# Patient Record
Sex: Female | Born: 1955 | Race: White | Hispanic: No | Smoking: Current every day smoker
Health system: Southern US, Community
[De-identification: ages and names within clinical notes are randomized; demographics above are authoritative.]

## PROBLEM LIST (undated history)

## (undated) DIAGNOSIS — G8929 Other chronic pain: Secondary | ICD-10-CM

## (undated) DIAGNOSIS — K219 Gastro-esophageal reflux disease without esophagitis: Secondary | ICD-10-CM

## (undated) DIAGNOSIS — M549 Dorsalgia, unspecified: Secondary | ICD-10-CM

## (undated) HISTORY — PX: TONSILLECTOMY: SUR1361

## (undated) HISTORY — PX: TOTAL KNEE ARTHROPLASTY: SHX125

## (undated) HISTORY — PX: APPENDECTOMY: SHX54

---

## 1998-02-26 ENCOUNTER — Ambulatory Visit (HOSPITAL_COMMUNITY): Admission: RE | Admit: 1998-02-26 | Discharge: 1998-02-26 | Payer: Self-pay | Admitting: General Surgery

## 1999-01-19 ENCOUNTER — Emergency Department (HOSPITAL_COMMUNITY): Admission: EM | Admit: 1999-01-19 | Discharge: 1999-01-19 | Payer: Self-pay | Admitting: *Deleted

## 1999-01-31 ENCOUNTER — Encounter: Admission: RE | Admit: 1999-01-31 | Discharge: 1999-01-31 | Payer: Self-pay | Admitting: Family Medicine

## 1999-03-01 ENCOUNTER — Encounter: Admission: RE | Admit: 1999-03-01 | Discharge: 1999-03-01 | Payer: Self-pay | Admitting: Family Medicine

## 1999-06-19 ENCOUNTER — Encounter: Admission: RE | Admit: 1999-06-19 | Discharge: 1999-07-18 | Payer: Self-pay | Admitting: Specialist

## 1999-07-11 ENCOUNTER — Encounter: Admission: RE | Admit: 1999-07-11 | Discharge: 1999-07-11 | Payer: Self-pay | Admitting: Family Medicine

## 1999-12-25 ENCOUNTER — Encounter: Admission: RE | Admit: 1999-12-25 | Discharge: 1999-12-25 | Payer: Self-pay | Admitting: Family Medicine

## 1999-12-25 ENCOUNTER — Other Ambulatory Visit: Admission: RE | Admit: 1999-12-25 | Discharge: 1999-12-25 | Payer: Self-pay | Admitting: Obstetrics & Gynecology

## 2000-02-28 ENCOUNTER — Encounter: Payer: Self-pay | Admitting: Neurosurgery

## 2000-02-28 ENCOUNTER — Ambulatory Visit (HOSPITAL_COMMUNITY): Admission: RE | Admit: 2000-02-28 | Discharge: 2000-02-28 | Payer: Self-pay | Admitting: Neurosurgery

## 2000-03-04 ENCOUNTER — Ambulatory Visit (HOSPITAL_COMMUNITY): Admission: RE | Admit: 2000-03-04 | Discharge: 2000-03-04 | Payer: Self-pay | Admitting: Specialist

## 2000-03-04 ENCOUNTER — Encounter: Payer: Self-pay | Admitting: Specialist

## 2000-03-27 ENCOUNTER — Encounter: Payer: Self-pay | Admitting: Neurosurgery

## 2000-03-30 ENCOUNTER — Encounter: Admission: RE | Admit: 2000-03-30 | Discharge: 2000-03-30 | Payer: Self-pay | Admitting: Family Medicine

## 2000-03-31 ENCOUNTER — Inpatient Hospital Stay (HOSPITAL_COMMUNITY): Admission: RE | Admit: 2000-03-31 | Discharge: 2000-04-06 | Payer: Self-pay | Admitting: Neurosurgery

## 2000-03-31 ENCOUNTER — Encounter: Payer: Self-pay | Admitting: Neurosurgery

## 2000-05-15 ENCOUNTER — Encounter: Admission: RE | Admit: 2000-05-15 | Discharge: 2000-05-15 | Payer: Self-pay | Admitting: Family Medicine

## 2000-05-18 ENCOUNTER — Encounter: Admission: RE | Admit: 2000-05-18 | Discharge: 2000-07-09 | Payer: Self-pay | Admitting: Neurosurgery

## 2000-06-18 ENCOUNTER — Encounter: Admission: RE | Admit: 2000-06-18 | Discharge: 2000-06-18 | Payer: Self-pay | Admitting: Family Medicine

## 2000-07-02 ENCOUNTER — Encounter: Admission: RE | Admit: 2000-07-02 | Discharge: 2000-07-02 | Payer: Self-pay | Admitting: Family Medicine

## 2000-07-16 ENCOUNTER — Encounter: Admission: RE | Admit: 2000-07-16 | Discharge: 2000-07-16 | Payer: Self-pay | Admitting: Family Medicine

## 2000-08-19 ENCOUNTER — Encounter: Admission: RE | Admit: 2000-08-19 | Discharge: 2000-08-19 | Payer: Self-pay | Admitting: Family Medicine

## 2000-10-13 ENCOUNTER — Encounter: Admission: RE | Admit: 2000-10-13 | Discharge: 2000-10-13 | Payer: Self-pay | Admitting: Family Medicine

## 2000-10-25 ENCOUNTER — Encounter: Payer: Self-pay | Admitting: Neurosurgery

## 2000-10-25 ENCOUNTER — Ambulatory Visit (HOSPITAL_COMMUNITY): Admission: RE | Admit: 2000-10-25 | Discharge: 2000-10-25 | Payer: Self-pay | Admitting: Neurosurgery

## 2000-10-27 ENCOUNTER — Encounter: Admission: RE | Admit: 2000-10-27 | Discharge: 2000-10-27 | Payer: Self-pay | Admitting: Family Medicine

## 2000-12-28 ENCOUNTER — Encounter: Admission: RE | Admit: 2000-12-28 | Discharge: 2001-02-10 | Payer: Self-pay | Admitting: Neurosurgery

## 2001-04-08 ENCOUNTER — Encounter: Admission: RE | Admit: 2001-04-08 | Discharge: 2001-04-08 | Payer: Self-pay | Admitting: Family Medicine

## 2001-04-14 ENCOUNTER — Encounter: Payer: Self-pay | Admitting: Sports Medicine

## 2001-04-14 ENCOUNTER — Encounter: Admission: RE | Admit: 2001-04-14 | Discharge: 2001-04-14 | Payer: Self-pay | Admitting: Sports Medicine

## 2001-12-14 ENCOUNTER — Encounter: Admission: RE | Admit: 2001-12-14 | Discharge: 2001-12-14 | Payer: Self-pay | Admitting: Family Medicine

## 2002-06-13 ENCOUNTER — Emergency Department (HOSPITAL_COMMUNITY): Admission: EM | Admit: 2002-06-13 | Discharge: 2002-06-13 | Payer: Self-pay | Admitting: Emergency Medicine

## 2002-06-13 ENCOUNTER — Encounter: Payer: Self-pay | Admitting: Emergency Medicine

## 2002-11-04 ENCOUNTER — Ambulatory Visit (HOSPITAL_COMMUNITY): Admission: RE | Admit: 2002-11-04 | Discharge: 2002-11-04 | Payer: Self-pay | Admitting: Family Medicine

## 2002-11-04 ENCOUNTER — Encounter: Admission: RE | Admit: 2002-11-04 | Discharge: 2002-11-04 | Payer: Self-pay | Admitting: Family Medicine

## 2002-11-29 ENCOUNTER — Encounter: Admission: RE | Admit: 2002-11-29 | Discharge: 2002-11-29 | Payer: Self-pay | Admitting: Family Medicine

## 2002-12-07 ENCOUNTER — Encounter: Admission: RE | Admit: 2002-12-07 | Discharge: 2002-12-07 | Payer: Self-pay | Admitting: Family Medicine

## 2003-01-09 ENCOUNTER — Encounter: Admission: RE | Admit: 2003-01-09 | Discharge: 2003-01-09 | Payer: Self-pay | Admitting: Family Medicine

## 2003-01-20 ENCOUNTER — Encounter: Admission: RE | Admit: 2003-01-20 | Discharge: 2003-01-20 | Payer: Self-pay | Admitting: Family Medicine

## 2003-03-13 ENCOUNTER — Encounter: Admission: RE | Admit: 2003-03-13 | Discharge: 2003-03-13 | Payer: Self-pay | Admitting: Orthopedic Surgery

## 2003-03-13 ENCOUNTER — Encounter: Payer: Self-pay | Admitting: Orthopedic Surgery

## 2003-03-14 ENCOUNTER — Ambulatory Visit (HOSPITAL_BASED_OUTPATIENT_CLINIC_OR_DEPARTMENT_OTHER): Admission: RE | Admit: 2003-03-14 | Discharge: 2003-03-15 | Payer: Self-pay | Admitting: Orthopedic Surgery

## 2003-03-14 ENCOUNTER — Ambulatory Visit (HOSPITAL_COMMUNITY): Admission: RE | Admit: 2003-03-14 | Discharge: 2003-03-14 | Payer: Self-pay | Admitting: Orthopedic Surgery

## 2003-06-28 ENCOUNTER — Encounter: Admission: RE | Admit: 2003-06-28 | Discharge: 2003-07-26 | Payer: Self-pay | Admitting: Orthopedic Surgery

## 2003-09-04 ENCOUNTER — Encounter: Admission: RE | Admit: 2003-09-04 | Discharge: 2003-09-04 | Payer: Self-pay | Admitting: Family Medicine

## 2003-09-06 ENCOUNTER — Encounter: Admission: RE | Admit: 2003-09-06 | Discharge: 2003-09-06 | Payer: Self-pay | Admitting: Sports Medicine

## 2003-10-19 ENCOUNTER — Encounter: Admission: RE | Admit: 2003-10-19 | Discharge: 2003-10-19 | Payer: Self-pay | Admitting: Sports Medicine

## 2003-10-24 ENCOUNTER — Encounter: Admission: RE | Admit: 2003-10-24 | Discharge: 2003-10-24 | Payer: Self-pay | Admitting: Family Medicine

## 2003-11-02 ENCOUNTER — Encounter: Admission: RE | Admit: 2003-11-02 | Discharge: 2003-11-02 | Payer: Self-pay | Admitting: Family Medicine

## 2003-11-22 ENCOUNTER — Encounter: Admission: RE | Admit: 2003-11-22 | Discharge: 2003-11-22 | Payer: Self-pay | Admitting: Sports Medicine

## 2003-12-06 ENCOUNTER — Encounter: Admission: RE | Admit: 2003-12-06 | Discharge: 2004-01-09 | Payer: Self-pay | Admitting: *Deleted

## 2004-02-02 ENCOUNTER — Encounter: Admission: RE | Admit: 2004-02-02 | Discharge: 2004-02-02 | Payer: Self-pay | Admitting: Family Medicine

## 2004-02-22 ENCOUNTER — Ambulatory Visit: Payer: Self-pay | Admitting: Family Medicine

## 2004-04-24 ENCOUNTER — Ambulatory Visit: Payer: Self-pay | Admitting: Sports Medicine

## 2004-05-06 ENCOUNTER — Encounter: Admission: RE | Admit: 2004-05-06 | Discharge: 2004-05-06 | Payer: Self-pay | Admitting: Family Medicine

## 2004-05-27 ENCOUNTER — Ambulatory Visit: Payer: Self-pay | Admitting: Family Medicine

## 2004-06-09 ENCOUNTER — Encounter (INDEPENDENT_AMBULATORY_CARE_PROVIDER_SITE_OTHER): Payer: Self-pay | Admitting: *Deleted

## 2004-06-09 LAB — CONVERTED CEMR LAB: Pap Smear: NORMAL

## 2004-07-04 ENCOUNTER — Ambulatory Visit: Payer: Self-pay | Admitting: Sports Medicine

## 2004-07-26 ENCOUNTER — Ambulatory Visit (HOSPITAL_COMMUNITY): Admission: RE | Admit: 2004-07-26 | Discharge: 2004-07-26 | Payer: Self-pay | Admitting: Family Medicine

## 2004-08-05 ENCOUNTER — Ambulatory Visit: Payer: Self-pay | Admitting: Family Medicine

## 2004-09-05 ENCOUNTER — Ambulatory Visit: Payer: Self-pay | Admitting: Family Medicine

## 2004-10-04 ENCOUNTER — Ambulatory Visit: Payer: Self-pay | Admitting: Family Medicine

## 2004-10-15 ENCOUNTER — Ambulatory Visit: Payer: Self-pay | Admitting: Family Medicine

## 2005-01-31 ENCOUNTER — Encounter: Admission: RE | Admit: 2005-01-31 | Discharge: 2005-01-31 | Payer: Self-pay | Admitting: Specialist

## 2005-03-10 ENCOUNTER — Ambulatory Visit: Payer: Self-pay | Admitting: Family Medicine

## 2005-03-11 ENCOUNTER — Encounter: Admission: RE | Admit: 2005-03-11 | Discharge: 2005-03-11 | Payer: Self-pay | Admitting: Sports Medicine

## 2005-03-11 ENCOUNTER — Ambulatory Visit: Payer: Self-pay | Admitting: Sports Medicine

## 2005-04-08 ENCOUNTER — Ambulatory Visit: Payer: Self-pay | Admitting: Family Medicine

## 2005-07-09 ENCOUNTER — Ambulatory Visit: Payer: Self-pay | Admitting: Family Medicine

## 2005-09-01 ENCOUNTER — Encounter: Admission: RE | Admit: 2005-09-01 | Discharge: 2005-09-01 | Payer: Self-pay | Admitting: Sports Medicine

## 2005-09-01 ENCOUNTER — Ambulatory Visit: Payer: Self-pay | Admitting: Family Medicine

## 2005-09-02 ENCOUNTER — Encounter: Admission: RE | Admit: 2005-09-02 | Discharge: 2005-09-02 | Payer: Self-pay | Admitting: Sports Medicine

## 2005-10-23 ENCOUNTER — Encounter (INDEPENDENT_AMBULATORY_CARE_PROVIDER_SITE_OTHER): Payer: Self-pay | Admitting: *Deleted

## 2005-10-23 ENCOUNTER — Ambulatory Visit (HOSPITAL_BASED_OUTPATIENT_CLINIC_OR_DEPARTMENT_OTHER): Admission: RE | Admit: 2005-10-23 | Discharge: 2005-10-23 | Payer: Self-pay | Admitting: Orthopedic Surgery

## 2005-11-13 ENCOUNTER — Ambulatory Visit: Payer: Self-pay | Admitting: Family Medicine

## 2006-01-01 ENCOUNTER — Ambulatory Visit: Payer: Self-pay | Admitting: Sports Medicine

## 2006-01-12 ENCOUNTER — Ambulatory Visit: Payer: Self-pay | Admitting: Family Medicine

## 2006-02-06 ENCOUNTER — Ambulatory Visit: Payer: Self-pay | Admitting: Family Medicine

## 2006-02-20 ENCOUNTER — Inpatient Hospital Stay (HOSPITAL_COMMUNITY): Admission: RE | Admit: 2006-02-20 | Discharge: 2006-02-24 | Payer: Self-pay | Admitting: Specialist

## 2006-03-25 ENCOUNTER — Encounter: Admission: RE | Admit: 2006-03-25 | Discharge: 2006-04-23 | Payer: Self-pay | Admitting: Specialist

## 2006-05-20 ENCOUNTER — Encounter: Admission: RE | Admit: 2006-05-20 | Discharge: 2006-05-20 | Payer: Self-pay | Admitting: Neurosurgery

## 2006-06-04 ENCOUNTER — Encounter: Admission: RE | Admit: 2006-06-04 | Discharge: 2006-06-04 | Payer: Self-pay | Admitting: Neurosurgery

## 2006-07-13 ENCOUNTER — Observation Stay (HOSPITAL_COMMUNITY): Admission: RE | Admit: 2006-07-13 | Discharge: 2006-07-13 | Payer: Self-pay | Admitting: Neurosurgery

## 2006-08-06 DIAGNOSIS — M949 Disorder of cartilage, unspecified: Secondary | ICD-10-CM

## 2006-08-06 DIAGNOSIS — I10 Essential (primary) hypertension: Secondary | ICD-10-CM

## 2006-08-06 DIAGNOSIS — J309 Allergic rhinitis, unspecified: Secondary | ICD-10-CM | POA: Insufficient documentation

## 2006-08-06 DIAGNOSIS — J45909 Unspecified asthma, uncomplicated: Secondary | ICD-10-CM | POA: Insufficient documentation

## 2006-08-06 DIAGNOSIS — M899 Disorder of bone, unspecified: Secondary | ICD-10-CM | POA: Insufficient documentation

## 2006-08-06 DIAGNOSIS — E669 Obesity, unspecified: Secondary | ICD-10-CM | POA: Insufficient documentation

## 2006-08-06 DIAGNOSIS — F17200 Nicotine dependence, unspecified, uncomplicated: Secondary | ICD-10-CM

## 2006-08-06 DIAGNOSIS — M1990 Unspecified osteoarthritis, unspecified site: Secondary | ICD-10-CM | POA: Insufficient documentation

## 2006-08-06 DIAGNOSIS — E7800 Pure hypercholesterolemia, unspecified: Secondary | ICD-10-CM

## 2006-08-06 DIAGNOSIS — K219 Gastro-esophageal reflux disease without esophagitis: Secondary | ICD-10-CM

## 2006-08-07 ENCOUNTER — Encounter (INDEPENDENT_AMBULATORY_CARE_PROVIDER_SITE_OTHER): Payer: Self-pay | Admitting: *Deleted

## 2007-01-11 ENCOUNTER — Encounter (INDEPENDENT_AMBULATORY_CARE_PROVIDER_SITE_OTHER): Payer: Self-pay | Admitting: Family Medicine

## 2007-02-05 ENCOUNTER — Encounter (INDEPENDENT_AMBULATORY_CARE_PROVIDER_SITE_OTHER): Payer: Self-pay | Admitting: Family Medicine

## 2007-02-22 ENCOUNTER — Telehealth (INDEPENDENT_AMBULATORY_CARE_PROVIDER_SITE_OTHER): Payer: Self-pay | Admitting: Family Medicine

## 2007-03-11 ENCOUNTER — Ambulatory Visit: Payer: Self-pay | Admitting: Family Medicine

## 2007-03-11 LAB — CONVERTED CEMR LAB: HDL goal, serum: 40 mg/dL

## 2007-03-19 ENCOUNTER — Ambulatory Visit: Payer: Self-pay | Admitting: Family Medicine

## 2007-03-19 ENCOUNTER — Encounter (INDEPENDENT_AMBULATORY_CARE_PROVIDER_SITE_OTHER): Payer: Self-pay | Admitting: Family Medicine

## 2007-03-19 LAB — CONVERTED CEMR LAB
AST: 13 units/L (ref 0–37)
Albumin: 4.5 g/dL (ref 3.5–5.2)
Alkaline Phosphatase: 111 units/L (ref 39–117)
BUN: 15 mg/dL (ref 6–23)
Calcium: 9.5 mg/dL (ref 8.4–10.5)
Chloride: 101 meq/L (ref 96–112)
HCT: 44.2 % (ref 36.0–46.0)
LDL Cholesterol: 56 mg/dL (ref 0–99)
MCV: 96.3 fL (ref 78.0–100.0)
RBC: 4.59 M/uL (ref 3.87–5.11)
TSH: 1.09 microintl units/mL (ref 0.350–5.50)
Total Bilirubin: 0.6 mg/dL (ref 0.3–1.2)
VLDL: 47 mg/dL — ABNORMAL HIGH (ref 0–40)

## 2007-03-31 ENCOUNTER — Encounter: Admission: RE | Admit: 2007-03-31 | Discharge: 2007-03-31 | Payer: Self-pay | Admitting: Family Medicine

## 2007-06-25 ENCOUNTER — Telehealth: Payer: Self-pay | Admitting: Family Medicine

## 2007-11-13 IMAGING — RF IR MYELOGRAM [PERSON_NAME]
12 of 24 series · 12 of 24 positions shown · IV contrast (omnipaque)
Comparison: none

CLINICAL DATA: Mid back pain.  Lower back pain.  Increasingly severe pain radiating to the left hip and leg.  Previous thoracic laminectomy with shunt for treatment of cyst and/or tumor. 
LUMBAR MYELOGRAM:
Following informed consent, sterile preparation of the back, and adequate local anesthesia, lumbar puncture was initially attempted at L5-S1 but was unsuccessful due to a mixed injection.  I repositioned the needle at L3-4 on the left from a right paramedian approach. 10cc of Omnipaque 180 was instilled into the subarachnoid space.  
Significant waist like narrowing at L4-5 with bilateral L5 nerve root encroachment is present.   Moderate tapering of the thecal sac below this, not definitely pathologic.  Moderate ventral defect at L3-4.  Conus medullaris normal. Flexion/extension films were not performed because of the requirement for thoracic myelogram.
TECHNIQUE: Multidetector CT imaging of the thoracic spine was performed after intrathecal injection of contrast.  Multiplanar CT image reconstructions were also generated.
TECHNIQUE: Multidetector CT imaging of the lumbar spine was performed after intrathecal injection of contrast.  Multiplanar CT image reconstructions were also generated.  
L1-2:  Normal interspace. 
L2-3:  Normal interspace. 
L3-4:  Moderate size central protrusion.  No definite L4 nerve root encroachment.  
L4-5:  Multifactorial stenosis related to broad based disc protrusion central and to the left along with marked posterior element hypertrophy; bilateral L5 nerve root encroachment is present left greater than right.  Vacuum disc phenomenon with disc space narrowing also present.  Some biforaminal narrowing but no definite L4 nerve root compromise. 
L5-S1:  Advanced disc space narrowing.  Mild facet arthropathy.  Moderate epidural fat appears to gently comptress the thecal sac, consistent with epidural lipomatosis.  Asymmetric foraminal narrowing is seen on the left   due to disc material, disc space narrowing, and bony overgrowth with left L5 nerve root compression.

[Series 2: myelogram  white · 1 of 1 slices shown (1 of 12)]
[im 1/1]
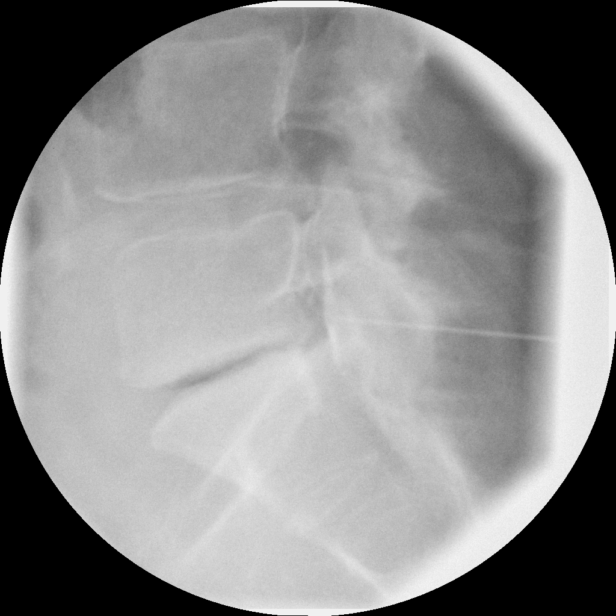

[Series 4: myelogram  white · 1 of 1 slices shown (2 of 12)]
[im 1/1]
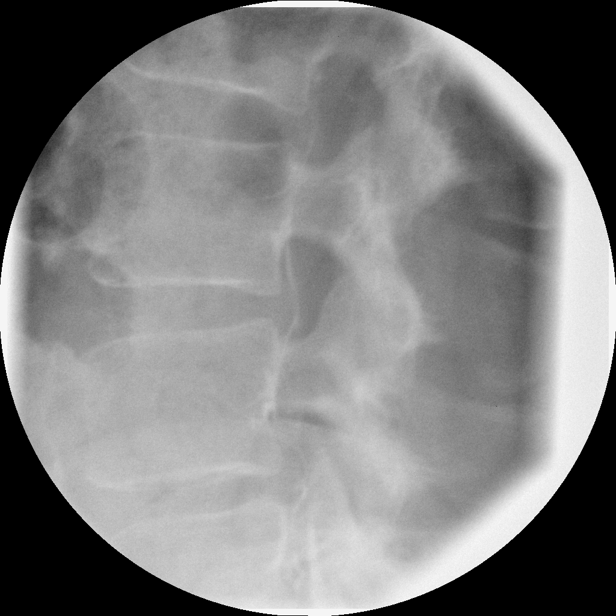

[Series 6: myelogram  white · 1 of 1 slices shown (3 of 12)]
[im 1/1]
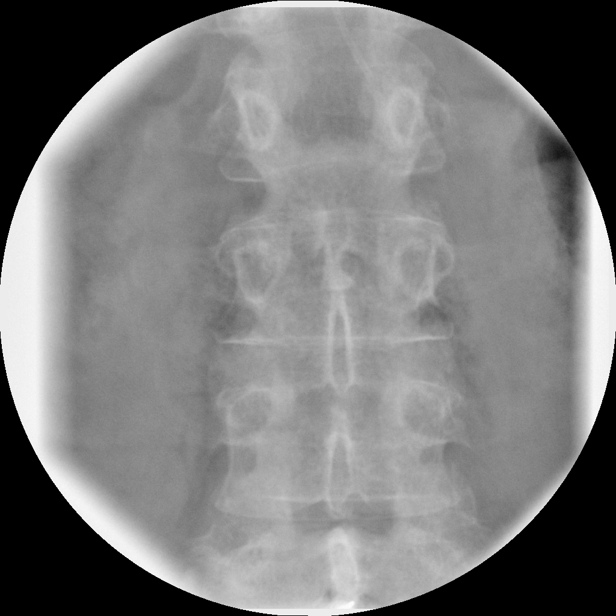

[Series 8: myelogram  white · 1 of 1 slices shown (4 of 12)]
[im 1/1]
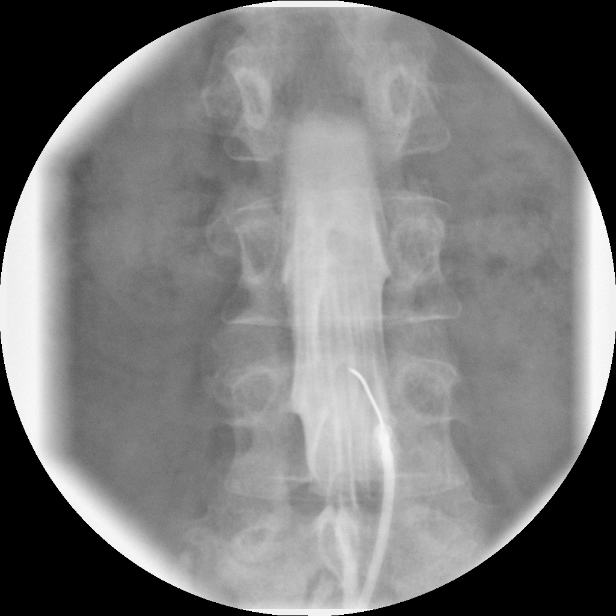

[Series 10: myelogram  white · 1 of 1 slices shown (5 of 12)]
[im 1/1]
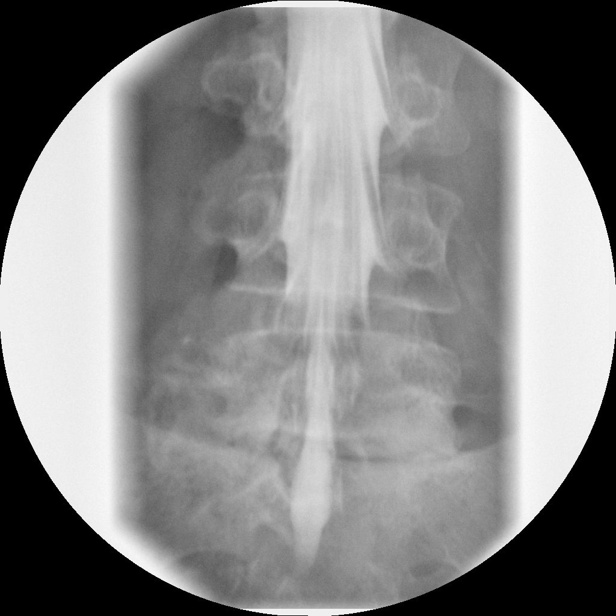

[Series 12: myelogram  white · 1 of 1 slices shown (6 of 12)]
[im 1/1]
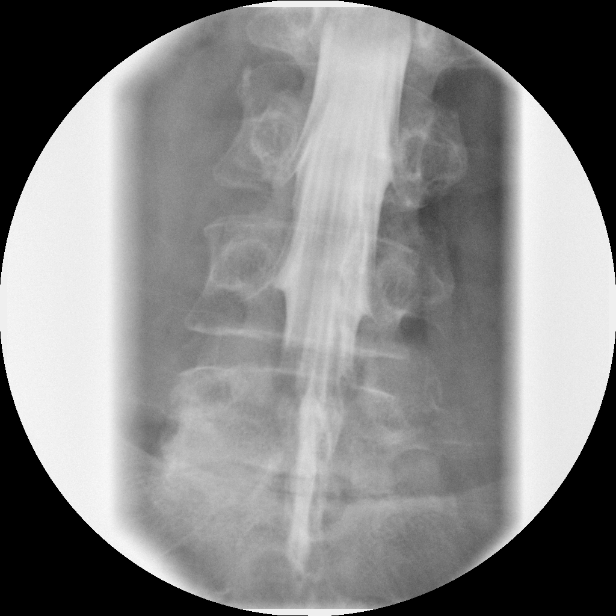

[Series 14: myelogram  white · 1 of 1 slices shown (7 of 12)]
[im 1/1]
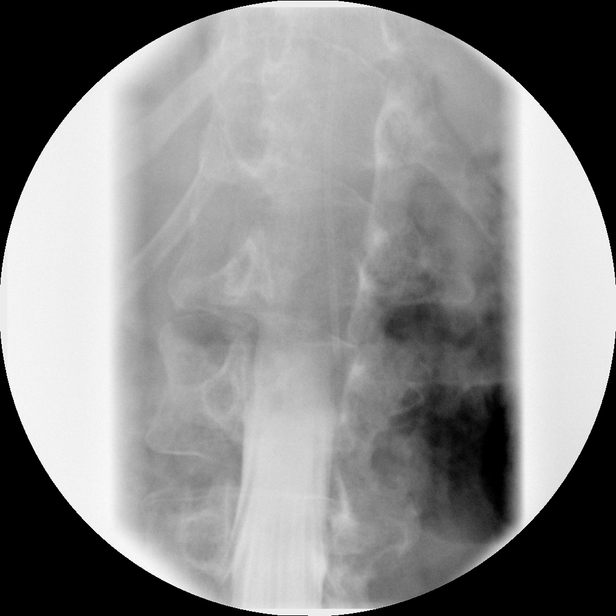

[Series 16: myelogram  white · 1 of 1 slices shown (8 of 12)]
[im 1/1]
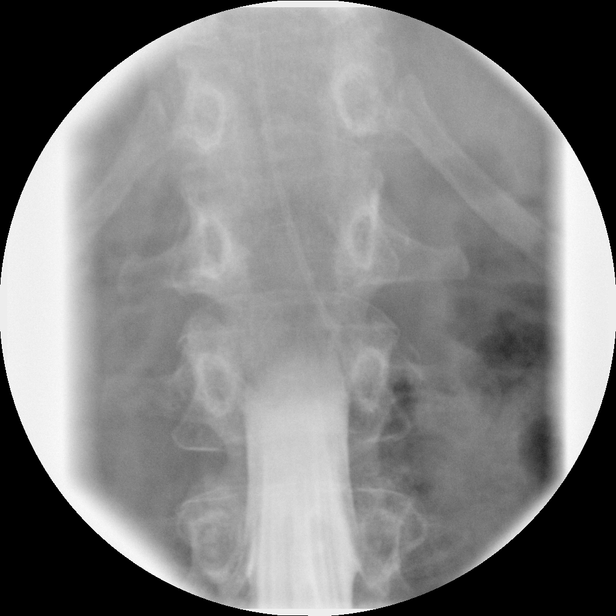

[Series 18: myelogram  white · 1 of 1 slices shown (9 of 12)]
[im 1/1]
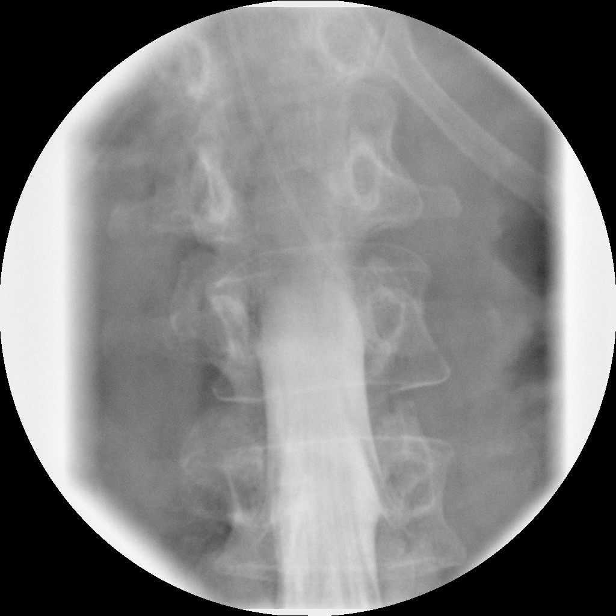

[Series 20: myelogram  white · 1 of 1 slices shown (10 of 12)]
[im 1/1]
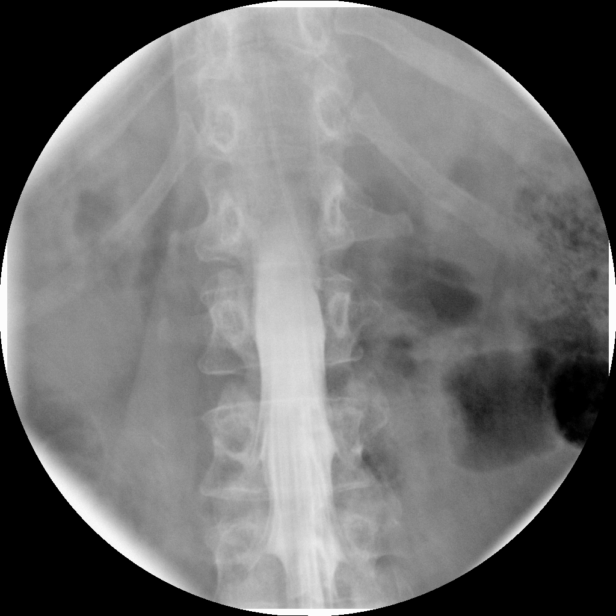

[Series 22: myelogram  white · 1 of 1 slices shown (11 of 12)]
[im 1/1]
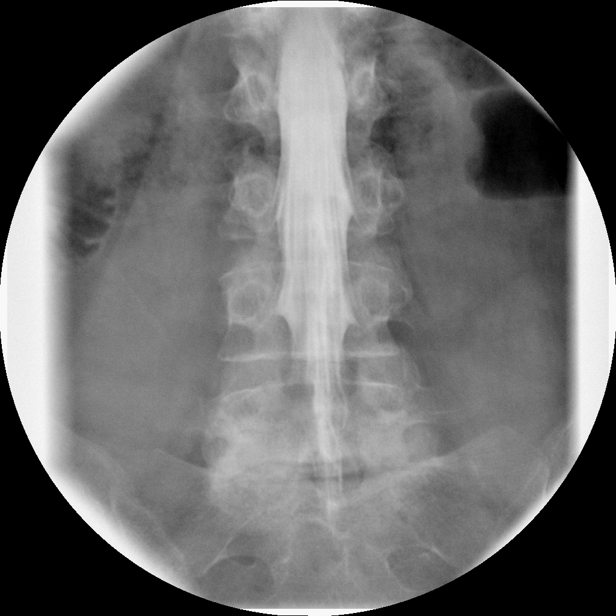

[Series 24: myelogram  white · 1 of 1 slices shown (12 of 12)]
[im 1/1]
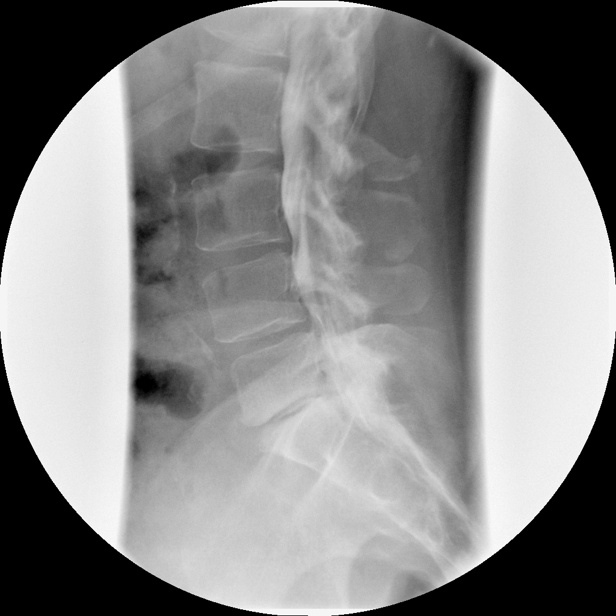

[12 of 24 positions shown; findings below may reference images not displayed]

IMPRESSION: Multilevel degenerative change with significant  stenosis at L4-5.
THORACIC MYELOGRAM:
Contrast was maneuvered  into the thoracic region.  There has been a thoracic laminectomy and upper lumbar laminectomy from L1 through T10.  There is advanced disc space narrowing at T8-9, T9-10, T10-11, and T11-12.  Shallow ventral defect is seen at T9-10.  The cord appears mildly atrophic in the lower thoracic region and dorsally tethered at the surgical site.  No definite upper thoracic abnormalities are seen.
IMPRESSION: Post surgical changes as described. 
CT THORACIC SPINE WITH CONTRAST (POST ? MYELOGRAM):
FINDINGS: Correlation was made with prior MRI from 05/20/06.  The numbering scheme employed was the same as used in the prior MRI.  
T1-2:  Normal interspace.  
T2-3:  Normal interspace.  
T3-4:  Shallow central protrusion.  No cord compression.  
T4-5:  Mild anterior spurring. 
T5-6:  Mild anterior spurring.  
T6-7:  Shallow left paracentral protrusion.  No cord compression.  
T7-8:  Shallow central protrusion.  No cord compression.  
T8-9:  Disc space narrowing with anterior spurring.  Shallow posterior protrusion.  No cord compression. 
T9-10:  Heavily calcified right paracentral protrusion with large lateral spur.  The cord appears slightly atrophic at this level. 
T10-11:  Advanced disc space narrowing and wide decompressing laminectomy. Shunt tube in the left side of the cord in apparent good position, extending to the subarachnoid space.  No syrinx is seen.  Cord appears fairly normal in size. 
T11-12:  Shallow central protrusion.  No cord compression.  Wide decompressive laminectomy. 
T12-L1:  Normal interspace.  Conus appears normal. 
No significant findings which differ from the MR can be appreciated.
IMPRESSION: 1.  Multiple thoracic disc protrusions with the only one of significance observed at T9-10, right with heavy calcification. 
2.  Wide decompressive laminectomy from T10 through L1. 
3.  A syrinx to subarachnoid space shunt is seen on the left within the cord beginning at T10 and extending downward.  
4.  The cord does not appear expanded nor is there evidence for central fluid lesion, there may be mild atrophic change at T9-10 however.  
CT LUMBAR SPINE W/CONTRAST (POST-MYELOGRAM):
IMPRESSION: 1.  Multifactoral spinal stenosis at L4-5 worse on the left where posterior element hypertrophy along with a broad based disc protrusion central and to the left results in spinal stenosis and bilateral L5 nerve root encroachment left greater than right. 
2.  Central protrusion at L3-4 with mild facet arthropathy as well; equivocal left L4 nerve root encroachment is present. 
3.  Severe disc space narrowing L5-S1 with suspected epidural lipomatosis.  No frank S1 nerve root encroachment.  
4.  Asymmetric foraminal narrowing L5-S1, left due to bony overgrowth, disc space narrowing, and disc material, compressing the left L5 root.

## 2007-12-01 ENCOUNTER — Encounter (INDEPENDENT_AMBULATORY_CARE_PROVIDER_SITE_OTHER): Payer: Self-pay | Admitting: *Deleted

## 2007-12-01 ENCOUNTER — Ambulatory Visit: Payer: Self-pay | Admitting: Family Medicine

## 2007-12-15 ENCOUNTER — Ambulatory Visit: Payer: Self-pay | Admitting: Family Medicine

## 2007-12-15 ENCOUNTER — Encounter: Payer: Self-pay | Admitting: Family Medicine

## 2007-12-17 LAB — CONVERTED CEMR LAB
ALT: 14 units/L (ref 0–35)
AST: 12 units/L (ref 0–37)
Alkaline Phosphatase: 87 units/L (ref 39–117)
CO2: 25 meq/L (ref 19–32)
Calcium: 9.6 mg/dL (ref 8.4–10.5)
Chloride: 103 meq/L (ref 96–112)
Direct LDL: 155 mg/dL — ABNORMAL HIGH
Potassium: 4.5 meq/L (ref 3.5–5.3)
Sodium: 140 meq/L (ref 135–145)

## 2008-04-27 ENCOUNTER — Ambulatory Visit (HOSPITAL_COMMUNITY): Admission: RE | Admit: 2008-04-27 | Discharge: 2008-04-27 | Payer: Self-pay | Admitting: Specialist

## 2008-05-11 ENCOUNTER — Encounter: Admission: RE | Admit: 2008-05-11 | Discharge: 2008-05-11 | Payer: Self-pay | Admitting: Neurosurgery

## 2008-07-24 ENCOUNTER — Ambulatory Visit: Payer: Self-pay | Admitting: Family Medicine

## 2008-07-25 ENCOUNTER — Encounter: Payer: Self-pay | Admitting: Family Medicine

## 2008-07-25 ENCOUNTER — Ambulatory Visit: Payer: Self-pay | Admitting: Family Medicine

## 2008-07-27 LAB — CONVERTED CEMR LAB
Albumin: 4.4 g/dL (ref 3.5–5.2)
Alkaline Phosphatase: 87 units/L (ref 39–117)
BUN: 9 mg/dL (ref 6–23)
Calcium: 9.8 mg/dL (ref 8.4–10.5)
Chloride: 104 meq/L (ref 96–112)
Cholesterol: 188 mg/dL (ref 0–200)
Creatinine, Ser: 0.68 mg/dL (ref 0.40–1.20)
Glucose, Bld: 92 mg/dL (ref 70–99)
HDL: 35 mg/dL — ABNORMAL LOW (ref >39)
LDL Cholesterol: 82 mg/dL (ref 0–99)
Potassium: 4.5 meq/L (ref 3.5–5.3)
Total CHOL/HDL Ratio: 5.4
Triglycerides: 355 mg/dL — ABNORMAL HIGH (ref <150)
VLDL: 71 mg/dL — ABNORMAL HIGH (ref 0–40)

## 2009-01-12 ENCOUNTER — Encounter: Admission: RE | Admit: 2009-01-12 | Discharge: 2009-01-12 | Payer: Self-pay | Admitting: Neurosurgery

## 2009-01-24 ENCOUNTER — Encounter: Payer: Self-pay | Admitting: Family Medicine

## 2009-01-24 ENCOUNTER — Ambulatory Visit: Payer: Self-pay | Admitting: Family Medicine

## 2009-01-24 DIAGNOSIS — M549 Dorsalgia, unspecified: Secondary | ICD-10-CM | POA: Insufficient documentation

## 2009-02-05 ENCOUNTER — Encounter: Admission: RE | Admit: 2009-02-05 | Discharge: 2009-02-05 | Payer: Self-pay | Admitting: Family Medicine

## 2009-07-19 ENCOUNTER — Ambulatory Visit: Payer: Self-pay | Admitting: Family Medicine

## 2009-07-19 ENCOUNTER — Encounter: Payer: Self-pay | Admitting: Family Medicine

## 2009-07-19 LAB — CONVERTED CEMR LAB

## 2009-07-23 ENCOUNTER — Ambulatory Visit: Payer: Self-pay | Admitting: Family Medicine

## 2010-02-07 ENCOUNTER — Ambulatory Visit: Payer: Self-pay | Admitting: Family Medicine

## 2010-02-07 DIAGNOSIS — M533 Sacrococcygeal disorders, not elsewhere classified: Secondary | ICD-10-CM

## 2010-02-24 ENCOUNTER — Encounter: Admission: RE | Admit: 2010-02-24 | Discharge: 2010-02-24 | Payer: Self-pay | Admitting: Orthopedic Surgery

## 2010-04-28 ENCOUNTER — Encounter: Payer: Self-pay | Admitting: Family Medicine

## 2010-07-11 NOTE — Miscellaneous (Signed)
   Clinical Lists Changes  Problems: Changed problem from ASTHMA, UNSPECIFIED (ICD-493.90) to ASTHMA, INTERMITTENT (ICD-493.90) 

## 2010-07-11 NOTE — Assessment & Plan Note (Signed)
Summary: MEET NEW DOC/MULTIPLE ISSUES/[facility]   Vital Signs:  Patient profile:   55 year old female Weight:      163.2 pounds Temp:     98.7 degrees F oral Pulse rate:   106 / minute Pulse rhythm:   regular BP sitting:   133 / 88  (right arm) Cuff size:   regular  Vitals Entered By: Gail Mendez CMA (February 07, 2010 2:15 PM)   Primary Care Gail Mendez:  Gail Don MD   History of Present Illness: CC:  coccydynia   1.  Coccygeal pain:  Patient fell in Jan/Feb of this year on ice and suffered immediate pain in tailbone area. Of note, she has long-standing history of back problems.  No incontinence of bladder or bowel.  No radiculopathy pain or paresthesias/numbness.  Unable to tell if area still bruised.  Pain has only been increasing in intensity since fall, no period of improvement. Some relief with OTC pain medications.   2.  Tobacco abuse: Still smoking some.  No motivation to quit.  3.  Asthma: No recent exacerbations.  Uses inhaler roughly once every few days.  4.  GERD: Well controlled on Omeprazole.  5.  HLD:  Most recent lipid panel 2/11 with elevated trigylcerides >300.  This is reason she is taking statin.  Asking if she needs more blood work today to check for liver function.      6. Prevention: States that she recently had a pap smear and breast exam.  Colonoscopy is uptodate and nl.  Does not exercise regularly b/c of back issues.  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 1 Tablet Daily For Heartburn 2)  Albuterol 90 Mcg/act  Aers (Albuterol) .... 2 Puffs Ever Fours As Neede For Wheezing 3)  Baclofen 20 Mg  Tabs (Baclofen) .... One Tablet Three Times A Day As Needed For Back Spasms 4)  Triamcinolone Acetonide 0.1 %  Crea (Triamcinolone Acetonide) .... Apply Two Times A Day Prn 5)  Simvastatin 40 Mg Tabs (Simvastatin) .... One Tablet By Mouth Daily For Cholesterol  Allergies (verified): 1)  ! Penicillin  Physical Exam  General:  Vital signs reviewed.  Blood pressure 130's today, will need recheck next visit. Well-developed, well-nourished patient in NAD.  Awake and cooperative  Mouth:  Oral mucosa pink and moist Dentition:  fair Neck:  Supple, full range of motion, no goiters or cervical lymphadenopathy noted  Lungs:  clear to auscultation bilaterally without wheezing, rales, or rhonchi.  Normal work of breathing  Heart:  Regular rate and rhythm without murmur, rub, or gallop.  Normal S1/S2  Abdomen:  soft/nondistended/nontender.  Bowel sounds present and normoactive.  No organomegaly noted.   Msk:  no cva tenderness to palpation Extremities:  No clubbing, cyanosis, edema, or deformity noted with normal full range of motion of all joints.   Neurologic:  alert & oriented X3 and cranial nerves II-XII intact.   Skin:  seborrheic keratosis of forearm b/l Psych:  Cognition and judgment appear intact. Alert and cooperative with normal attention span and concentration. No apparent delusions, illusions, hallucinations   Impression & Recommendations:  Problem # 1:  COCCYGEAL PAIN (ICD-724.79) Assessment Deteriorated Recommended she try wedge and donut cushions.  No red  flags today.  Due to extensive back history, will refer to Orthopedist for further evaluation of coccygeal pain.  Concern due to subacute versus chronic pain and no relief.   Orders: [facility]- Est  Level 4 (42595) Orthopedic Referral (Ortho)  Problem # 2:  TOBACCO DEPENDENCE (ICD-305.1) Assessment: Unchanged Not currently motivated to quit.  Will redress at future visits.    Problem # 3:  HYPERTENSION, BENIGN SYSTEMIC (ICD-401.1) Assessment: Deteriorated Blood pressures 130s today, will need recheck next visit.  May need to be restarted on medications if continues to creep upwards.  Goal <140.    Problem # 4:  HYPERCHOLESTEROLEMIA (ICD-272.0) On Statin for hypertriglyceridemia.  Last liver function check in Feb, will need to recheck every year.   Her updated medication list for this problem includes:    Simvastatin 40 Mg Tabs (Simvastatin) ..... One tablet by mouth daily for cholesterol  Orders: Comp Met-[facility] ([address]75-64332) [facility]- Est  Level 4 ([address]188)  Problem # 5:  GASTROESOPHAGEAL REFLUX, NO ESOPHAGITIS (ICD-530.81) Assessment: Unchanged Controlled.   Her updated medication list for this problem includes:    Omeprazole 20 Mg Cpdr (Omeprazole) .Marland Kitchen... 1 tablet daily for heartburn  Problem # 6:  ASTHMA, UNSPECIFIED (ICD-493.90) Assessment: Unchanged Discussed controller medication with past.  Declines today.  Will revisit at future appts. Her updated medication list for this problem includes:    Albuterol 90 Mcg/act Aers (Albuterol) .Marland Kitchen... 2 puffs ever fours as neede for wheezing  Complete Medication List: 1)  Omeprazole 20 Mg Cpdr (Omeprazole) .Marland Kitchen.. 1 tablet daily for heartburn 2)  Albuterol 90 Mcg/act Aers (Albuterol) .... 2 puffs ever fours as neede for wheezing 3)  Baclofen 20 Mg Tabs (Baclofen) .... One tablet three times a day as needed for back spasms 4)  Triamcinolone Acetonide 0.1 % Crea (Triamcinolone acetonide) .... Apply two times a day prn 5)  Simvastatin 40 Mg Tabs  (Simvastatin) .... One tablet by mouth daily for cholesterol  Patient Instructions: 1)  For your tailbone pain, try sitting on "donut" cushions or wedge cushions to take the pressure off your tailbone.   2)  We will refer your back to your Orthopedic doctor for the continued pain.   3)  We will need to check blood work today for your medications.   4)  I would keep trying the Benadryl and nasal saline for your sinus drainage.   5)  It was good to meet you today! Prescriptions: SIMVASTATIN 40 MG TABS (SIMVASTATIN) one tablet by mouth daily for cholesterol  #90 x 1   Entered and Authorized by:   Gail Don MD   Signed by:   Gail Don MD on 02/07/2010   Method used:   [facility]        [facility] [address] [phone]* (retail)       [address]       [place], [place]  [address]       Ph: [phone] or [phone]  Fax: [phone]   RxID:   [id number] ALBUTEROL 90 MCG/ACT  AERS (ALBUTEROL) 2 puffs ever fours as neede for wheezing  #1 x 1   Entered and Authorized by:   Gail Don MD   Signed by:   Gail Don MD on 02/07/2010   Method used:   [facility]ronically to        [facility] [address] [phone]* (retail)       [address]       [place], [place]  [address]       Ph: [phone] or [phone]       Fax: [phone]   RxID:   [id number] OMEPRAZOLE 20 MG CPDR (OMEPRAZOLE) 1 tablet daily for heartburn  #90 x 1   Entered and Authorized by:   Gail Don MD   Signed by:   Gail Don MD on 02/07/2010   Method used:   [facility] to        [facility] [address] [phone]* (retail)       [address]       [place], [place]  [address]       Ph: [phone] or [phone]       Fax: [phone]   RxID:   [id number]

## 2010-07-11 NOTE — Assessment & Plan Note (Signed)
Summary: Pyelo   Vital Signs:  Patient profile:   55 year old female Height:      62 inches Weight:      171.3 pounds BMI:     31.44 Temp:     100.5 degrees F oral Pulse rate:   125 / minute BP sitting:   137 / 84  (right arm) Cuff size:   regular  Vitals Entered By: Garen Grams LPN (July 19, 2009 10:04 AM)  CC: UTI Is Patient Diabetic? No Pain Assessment Patient in pain? yes     Location: headache   Primary Care Provider:  Marisue Ivan  MD  CC:  UTI.  History of Present Illness: 55 yo female with 1 day history of dysuria, urinary frequency, fevers/chills, nausea without vomiting.  Endorses chronic back pain, which is unchanged.  No history of frequent UTI.  Current Medications (verified): 1)  Omeprazole 20 Mg Cpdr (Omeprazole) .Marland Kitchen.. 1 Tablet Daily For Heartburn 2)  Albuterol 90 Mcg/act  Aers (Albuterol) .... 2 Puffs Ever Fours As Neede For Wheezing 3)  Baclofen 20 Mg  Tabs (Baclofen) .... One Tablet Three Times A Day As Needed For Back Spasms 4)  Triamcinolone Acetonide 0.1 %  Crea (Triamcinolone Acetonide) .... Apply Two Times A Day Prn 5)  Simvastatin 40 Mg Tabs (Simvastatin) .... One Tablet By Mouth Daily For Cholesterol  Allergies (verified): No Known Drug Allergies  Physical Exam  General:  Alert and oriented x3, obese, shivering, weak-appearing Abdomen:  Bowel sounds positive,abdomen soft and non-tender without masses, organomegaly or hernias noted. Msk:  No CVAT bilateral   Impression & Recommendations:  Problem # 1:  PYELONEPHRITIS, ACUTE (ICD-590.10) Assessment New Send urine culture. Ciprofloxacin 500 mg by mouth two times a day x1 week. Follow up on Monday.  Orders: [facility]- Est Level  3 ([address]454) Urine Culture-[facility] ([id number])  Complete Medication List: 1)  Omeprazole 20 Mg Cpdr (Omeprazole) .Marland Kitchen.. 1 tablet daily for heartburn 2)  Albuterol 90 Mcg/act Aers (Albuterol) .... 2 puffs ever fours as neede for wheezing 3)  Baclofen 20 Mg Tabs  (Baclofen) .... One tablet three times a day as needed for back spasms 4)  Triamcinolone Acetonide 0.1 % Crea (Triamcinolone acetonide) .... Apply two times a day prn 5)  Simvastatin 40 Mg Tabs (Simvastatin) .... One tablet by mouth daily for cholesterol 6)  Ciprofloxacin Hcl 500 Mg Tabs (Ciprofloxacin hcl) .Marland Kitchen.. 1 tab by mouth two times a day x7 days  Other Orders: Urinalysis-[facility] (00000)  Patient Instructions: 1)  Please schedule a follow-up appointment on Monday. 2)  Call to be seen sooner if you get worse:  high fever, vomiting. 3)  Take Ciprofloxacin (antibiotic) two times a day for 1 week. Prescriptions: CIPROFLOXACIN HCL 500 MG TABS (CIPROFLOXACIN HCL) 1 tab by mouth two times a day x7 days  #14 x 0   Entered and Authorized by:   Romero Belling MD   Signed by:   Romero Belling MD on 07/19/2009   Method used:   Electronically to        [facility] [address] [phone]* (retail)       [address]       [place], [place]  [address]       Ph: [phone] or [phone]       Fax: [phone]   RxID:   [id number]   Laboratory Results   Urine Tests  Date/Time Received: July 19, 2009 10:03 AM  Date/Time Reported:  July 19, 2009 10:18 AM   Routine Urinalysis   Color: yellow Appearance: Cloudy Glucose: negative   (Normal Range: Negative) Bilirubin: negative   (Normal Range: Negative) Ketone: negative   (Normal Range: Negative) Spec. Gravity: 1.010   (Normal Range: 1.003-1.035) Blood: moderate   (Normal Range: Negative) pH: 7.0   (Normal Range: 5.0-8.0) Protein: 30   (Normal Range: Negative) Urobilinogen: 1.0   (Normal Range: 0-1) Nitrite: positive   (Normal Range: Negative) Leukocyte Esterace: moderate   (Normal Range: Negative)  Urine Microscopic WBC/HPF: >20 RBC/HPF: 0-3 Bacteria/HPF: 3+ Epithelial/HPF: occ    Comments: ...........test performed by...........Marland KitchenTerese Door, CMA

## 2010-07-11 NOTE — Assessment & Plan Note (Signed)
Summary: acute pyelo- resolving   Vital Signs:  Patient profile:   55 year old female Height:      62 inches Weight:      172.1 pounds BMI:     31.59 Temp:     98.2 degrees F oral Pulse rate:   98 / minute BP sitting:   127 / 85  (left arm) Cuff size:   regular  Vitals Entered By: Gladstone Pih (July 23, 2009 2:48 PM) CC: F/U UTI and possible pyelo Is Patient Diabetic? No Pain Assessment Patient in pain? no        Primary Care Provider:  Marisue Ivan  MD  CC:  F/U UTI and possible pyelo.  History of Present Illness: 55yo here for f/u of presumed pyelo  Pyelo: States that she is feeling much better.  She has 2 more days of Cipro left.  No more N/V, dysuria, or flank pain.  Habits & Providers  Alcohol-Tobacco-Diet     Tobacco Status: current     Tobacco Counseling: to quit use of tobacco products     Cigarette Packs/Day: 0.5  Allergies (verified): 1)  ! Penicillin  Social History: Smoking Status:  current Packs/Day:  0.5  Review of Systems       No more N/V, dysuria, or flank pain.  Physical Exam  General:  VS Reviewed. Well appearing, NAD.  Lungs:  Normal respiratory effort, chest expands symmetrically. Lungs are clear to auscultation, no crackles or wheezes. Heart:  Normal rate and regular rhythm. S1 and S2 normal without gallop, murmur, click, rub or other extra sounds. Msk:  no cva tenderness to palpation   Impression & Recommendations:  Problem # 1:  PYELONEPHRITIS, ACUTE (ICD-590.10) Assessment Improved  Resolving.  2 more days of Cipro left.  Reviewed Ur Cx and Sensitivities.  Will f/u as needed.  Orders: [facility]- Est Level  3 ([address])  Complete Medication List: 1)  Omeprazole 20 Mg Cpdr (Omeprazole) .Marland Kitchen.. 1 tablet daily for heartburn 2)  Albuterol 90 Mcg/act Aers (Albuterol) .... 2 puffs ever fours as neede for wheezing 3)  Baclofen 20 Mg Tabs (Baclofen) .... One tablet three times a day as needed for back spasms 4)  Triamcinolone  Acetonide 0.1 % Crea (Triamcinolone acetonide) .... Apply two times a day prn 5)  Simvastatin 40 Mg Tabs (Simvastatin) .... One tablet by mouth daily for cholesterol 6)  Ciprofloxacin Hcl 500 Mg Tabs (Ciprofloxacin hcl) .Marland Kitchen.. 1 tab by mouth two times a day x7 days  Patient Instructions: 1)  Looks like you're getting better.  Finish the antibiotics.  Call me if there are any changes or worsening symptoms. Prescriptions: SIMVASTATIN 40 MG TABS (SIMVASTATIN) one tablet by mouth daily for cholesterol  #90 x 1   Entered and Authorized by:   Marisue Ivan  MD   Signed by:   Marisue Ivan  MD on 07/23/2009   Method used:   Electronically to        [facility] [address] [phone]* (retail)       [address]       [place], [place]  [address]       Ph: [phone] or [phone]       Fax: [phone]   RxID:   [id number] TRIAMCINOLONE ACETONIDE 0.1 %  CREA (TRIAMCINOLONE ACETONIDE) apply two times a day prn  #1 x 2   Entered and Authorized by:   Marisue Ivan  MD   Signed by:  Marisue Ivan  MD on 07/23/2009   Method used:   Electronically to        [facility] [address] [phone]* (retail)       [address]       [place], [place]  [address]       Ph: [phone] or [phone]       Fax: [phone]   RxID:   [id number] ALBUTEROL 90 MCG/ACT  AERS (ALBUTEROL) 2 puffs ever fours as neede for wheezing  #1 x 5   Entered and Authorized by:   Marisue Ivan  MD   Signed by:   Marisue Ivan  MD on 07/23/2009   Method used:   Electronically to        [facility] [address] [phone]* (retail)       [address]       [place], [place]  [address]       Ph: [phone] or [phone]       Fax: [phone]   RxID:   [id number] OMEPRAZOLE 20 MG CPDR (OMEPRAZOLE) 1 tablet daily for heartburn  #90 x 1   Entered and Authorized by:   Marisue Ivan  MD   Signed by:   Marisue Ivan  MD on  07/23/2009   Method used:   Electronically to        [facility] [address] [phone]* (retail)       [address]       [place], [place]  [address]       Ph: [phone] or [phone]       Fax: [phone]   RxID:   [id number]

## 2010-07-19 ENCOUNTER — Encounter: Payer: Self-pay | Admitting: *Deleted

## 2010-08-21 ENCOUNTER — Other Ambulatory Visit: Payer: Self-pay | Admitting: Family Medicine

## 2010-08-21 NOTE — Telephone Encounter (Signed)
Refill request

## 2010-10-25 NOTE — Op Note (Signed)
Gail Mendez, Gail Mendez                   ACCOUNT NO.:  [id number]   MEDICAL RECORD NO.:  [id number]          PATIENT TYPE:  INP   LOCATION:  1513                         FACILITY:  [facility]   PHYSICIAN:  Erasmo Leventhal, M.D.DATE OF BIRTH:  Jul 14, 1955   DATE OF PROCEDURE:  02/20/2006  DATE OF DISCHARGE:                                 OPERATIVE REPORT   PREOPERATIVE DIAGNOSIS:  Left knee end-stage osteoarthritis.   POSTOPERATIVE DIAGNOSIS:  Left knee end-stage osteoarthritis.   PROCEDURE:  Left total knee arthroplasty.   SURGEON:  Erasmo Leventhal, M.D.   ASSISTANT:  Jaquelyn Bitter. Chabon, P.A.   ANESTHESIA:  General.   ESTIMATED BLOOD LOSS:  Less than 50 cc.   DRAINS:  Two medium Hemovac drains.   COMPLICATIONS:  None.   TOURNIQUET TIME:  1 hour and 15 minutes at 300 mmHg.   DISPOSITION:  To PACU stable.   OPERATIVE DETAILS:  The patient was counseled in the holding area. Correct  side was identified. IV was started. Antibiotics were given. Taken to the  OR, placed in supine position. General anesthesia. Well-padded pump. Foley  catheter was placed utilizing sterile technique by the OR circulating nurse.  Left knee was examined. She had about 3 degrees of recurvatum; she flexed to  130 degrees. Elevated, prepped with DuraPrep and draped in sterile fashion.  Extremity was Esmarched. Tourniquet was inflated to 300 mmHg.   She had a previous transverse incision over the pubic tubercle region from  an operation she had had as a child. Skin incision was made longitudinally  transversing that, being delicate with skin edges. Medial and lateral soft  tissue flaps were developed. She had also had a patellar realignment as a  child. We were very cautious with extensor mechanism throughout the entire  case and protected it. Medial parapatellar arthrotomy was performed. The  patella was retracted but not everted out of the way. Cruciate ligaments  were resected. End-stage  arthritis was identified with bone-against-bone  contact. Starting hole made in the distal femur. Canal was irrigated. The  effluent was clear. An intermedullary rod was gently placed. We chose a 5-  degree valgus and a 9-mm cut off of the distal femur. Distal femur was found  to be a size #2. Rotation marks were made, and a __________  for a size 2.  Medial and lateral meniscus were removed. Geniculate vessels were  coagulated. Posterior neurovascular structures were sought out and protected  throughout the entire case. Tibial eminence was resected. Osteophytes  removed from the proximal tibia. It was found to also be a size 2. Central  aspect was made. Starting reamer was utilized, step reamer, irrigated, and  intramedullary rod was gently placed. Chose a 0-degree slope and chose a 10-  mm cut based off of the lateral side which was the least deficit side.  Posterior medial and posterior femoral osteophytes were removed. At this  point in time, flexion and extension blocks for a 10-mm insert was well  balanced in flexion and extension. Trials were then removed. Tibial base  plate was  applied. Rotation coverage was set, and a reamer and punch was  then utilized. Femoral box cut was now prepared at this time with a size 2  femur, a size 2 tibia with a 10-mm insert. We had excellent range of motion,  soft tissue balance and alignment. 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  It was found to be a size 32. Eight millimeters were resected. Locking holes  were made. Patellar button was applied. Again, we had patellofemoral  anatomic tracking. The lateral scars from her previous lateral release were  also identified that she had done as a youth, and these were left alone.  Again, skin flaps were left viable. Trials were then removed. Knee was  copiously irrigated with pulsatile lavage. Utilizing modem cement technique,  all components were cemented in place, a size 2 tibia, a size 2 femur, 10   insert with a 32 patella. After cement had cured, excess cement was removed,  and again, with a 10 trial, we had excellent balance, range of motion, soft  tissue balance, and patellar tracking was anatomic. Trial was then removed.  The final 10-mm posterior stabilized tibial insert was implanted. We now had  a well-aligned balanced knee with the patellofemoral tracking anatomically  stable to varus and valgus stress. The knee was then irrigated again, both  deep and superficial. After confirming with Dr. Rica Mast, he said we could  use 40 mL of 0.5% Marcaine with epinephrine. This was injected into the soft  tissues about the knee for postoperative pain. Closure was done. Arthrotomy  was closed at 90 degrees of flexion with Vicryl, subcu Vicryl and skin  closed with Monocryl suture. Steri-Strips were applied. Two medium Hemovac  drains were placed and had been placed into the knee joint. These were then  hooked to suction. Sterile dressing applied to the knee. Tourniquet was  deflated. Normal circulation of the foot and ankle at the end of the case.  She tolerated the procedure well with no complications. She was awakened,  and she was taken to the operating room in stable condition. Sponge and  needle count were correct. No complications or problems.   To help with surgical traction and decision-making throughout the entire  case, Mr. Jeannett Senior Chabon's assistance was needed.           ______________________________  Erasmo Leventhal, M.D.     RAC/MEDQ  D:  02/20/2006  T:  02/21/2006  Job:  [id number]

## 2010-10-25 NOTE — H&P (Signed)
Gail Mendez, Gail Mendez                   ACCOUNT NO.:  [id number]   MEDICAL RECORD NO.:  [id number]           PATIENT TYPE:   LOCATION:                                 FACILITY:   PHYSICIAN:  Erasmo Leventhal, M.D.DATE OF BIRTH:  01/19/1956   DATE OF ADMISSION:  02/20/2006  DATE OF DISCHARGE:                                HISTORY & PHYSICAL   CHIEF COMPLAINT:  Left knee end-stage osteoarthritis.   BRIEF HISTORY:  This is a 55 year old patient, well known to [place] with history  of bilateral patella realignment surgeries and multiple knee surgeries, who  has end-stage osteoarthritis of the left knee.  She has undergone a total  knee angioplasty of the right knee with good results and now wishes to  proceed with total knee angioplasty of the left knee due to failure of  conservative management.  The surgery, risks, benefits, and aftercare were  discussed in detail with the patient, questions invited and answered, and  surgery to go ahead as scheduled.  Her family doctor, Dr. Sheffield Slider, at [facility], has given the patient medical clearance to proceed with  surgery.   PAST MEDICAL HISTORY:   DRUG ALLERGY:  To PENICILLIN which is a rash, shortness of breath, and  itching type of reaction.   CURRENT MEDICATIONS:  1. Singulair 20 mg 1 daily.  2. Lipitor 20 mg 1 daily.  3. Prevacid 40 mg 1 daily.  4. Baclofen 20 mg 3 times a day.  5. Albuterol inhaler p.r.n.  6. Zaditor eye drops as needed.   PREVIOUS SURGERY:  1. Total knee angioplasty on the right.  2. Bilateral ankle surgery.  3. Bilateral knee arthroscopy.  4. Bilateral patellae realignment surgery.  5. Spinal surgery.  6. Ganglion surgery.   SERIOUS MEDICAL ILLNESSES:  1. Asthma.  2. Hypertension.  3. GERD.  4. Muscle spasm.  5. Hyperlipidemia.   FAMILY HISTORY:  Positive for coronary artery disease, hypertension,  diabetes, and osteoarthritis.   SOCIAL HISTORY:  The patient is separated.  She is disabled.  She  smokes one-  half pack a day and does not drink.   REVIEW OF SYSTEMS:  CENTRAL NERVOUS SYSTEM: Negative for headache, blurry  vision, or dizziness.  PULMONARY: Positive for asthma, negative for  shortness of breath, PND, orthopnea. CARDIOVASCULAR: Negative for chest pain  or palpitations.  Positive for hypertension. GI: Negative for ulcers or  hepatitis but positive for reflux.  GU: Negative for urinary tract  difficulty.  MUSCULOSKELETAL:  As in HPI.   PHYSICAL EXAMINATION:  VITAL SIGNS: Blood pressure 120/80, respirations 18,  pulse 78 and regular.  GENERAL APPEARANCE:  Well-developed, well-nourished lady in no acute  distress.  HEENT:  Head normocephalic.  Nose patent. Ears patent. Pupils equal, round,  and reactive to light. Throat without injection.  NECK: Supple without adenopathy.  Carotids 2+ without bruits.  CHEST: Clear to auscultation. No rales or rhonchi.  Respirations 18.  HEART: Regular rate and rhythm at 78 beats per minute without murmur.  ABDOMEN: Soft with active bowel sounds.  No mass  or organomegaly.  NEUROLOGIC: The patient is alert and oriented to time, place, and person.  Cranial nerves II-XII grossly intact.  EXTREMITIES:  Shows a left knee with a 2-degree flexion contracture with  further flexion to 135 degrees.  Neurovascular status intact.  Dorsalis  pedis and posterior tibial pulses are 2+.   X-rays show end-stage osteoarthritis of the left knee.   IMPRESSION:  End-stage osteoarthritis of the left knee.   PLAN:  Total knee angioplasty of the left knee.      Jaquelyn Bitter. Chabon, P.A.    ______________________________  Erasmo Leventhal, M.D.    SJC/MEDQ  D:  02/02/2006  T:  02/02/2006  Job:  [id number]

## 2010-10-25 NOTE — Discharge Summary (Signed)
Gail Mendez, Gail Mendez                   ACCOUNT NO.:  [id number]   MEDICAL RECORD NO.:  [id number]          PATIENT TYPE:  INP   LOCATION:  1513                         FACILITY:  [facility]   PHYSICIAN:  Erasmo Leventhal, M.D.DATE OF BIRTH:  1956-03-06   DATE OF ADMISSION:  02/20/2006  DATE OF DISCHARGE:  02/24/2006                                 DISCHARGE SUMMARY   ADMITTING DIAGNOSIS:  End-stage osteoarthritis of the left knee.   DISCHARGE DIAGNOSIS:  End-stage osteoarthritis of the left knee.   OPERATION:  Total knee arthroplasty, left knee.   BRIEF HISTORY:  This is a 55 year old lady well known to [place] with a history  of patellar realignment surgery and multiple knee surgeries in the pass who  has end-stage osteoarthritis to the left knee.  She has undergone a total  knee arthroplasty on the right knee with good results and now wishes to  proceed with total knee arthroplasty of the left knee.  The surgery risks,  benefits and after care were discussed in detail with the patient, questions  invited and answered and surgery to go ahead as scheduled.   LABORATORY VALUES:  Admission CBC within normal limits.  Hemoglobin and  hematocrit reached a low at 10.3 and 30.1 on the 17th.  She did have a  slightly high white count of 12.9 on 16th but by the 17th it corrected.  PT/PTT within normal limits at discharge.  INR 2.0, D-dimer was 1.97.  admission CMET within normal limits and remained within normal limits with  exception of mildly elevated glucose of 116, 138, and 124 through admission.  TSH was normal.  Urinalysis showed a small amount of bilirubin, otherwise  negative on admission.  On the 16th, she had 30 mg of protein and some  hyaline casts.  Urine culture showed no growth.   COURSE IN THE HOSPITAL:  The patient tolerated the operative procedure well.  Consult was made with [facility] for management of her asthma and  postoperative tachycardia.  First postoperative  day, vital signs were  stable.  She was feeling some pain.  Vital signs were stable.  Temperature  to 101.  Neurovascular status was intact.  Dressing was dry.  Hemovac was  removed without difficulty.  Neurovascular status was intact and physical  therapy was started.  Second postoperative day, vital signs were stable.  She was tachycardiac at 112, 02 93% on room air.  No complaints of shortness  of breath or chest pain.  Dressing was changed, wound was benign,  calves  were negative.  Lungs were clear.  Heart sounds normal and therapy was  continued.  Third postoperative day, she was feeling good.  She still was  with a tachycardia at 103, O2 98% on 2 liters, temperature 99, hemoglobin  10.3, hematocrit 30.1.  WBC was normal.  BMET showed glucose of 124, INRs  1.5.  Urinalysis showed hyaline casts and rare bacteria with a protein of  30.  There was no shortness of breath, no chest pain or burning on  urination.  Lungs were clear.  Heart sounds normal.  Bowel sounds active.  Calves negative.  Wound with minimal redness.  There was a question of an  early UTI and continued tachycardia.  Urine culture was sent and a medical  consult for tachycardia and possible UTI was obtained.  Medical consult was  concerned about a possible silent DVT with PE.  Had a D-dimer which was  elevated so she had a CT for evaluation of PE and this did not show a PE.   On February 24, 2006, the patient was feeling good, vital signs were  stable, tachycardia still at 113.  She was afebrile, O2 97% on room air.  INR 2.0, D-dimer was elevated but chest CT was negative for PE.  TSH was  normal.  Lungs were Clear.  Heart sounds normal.  Bowel sounds active.  Calves were negative and she was felt orthopedically stable for discharge if  medical service felt so after their evaluation that day.  Later that day,  she was seen by the medical service and she was felt to be stable for  discharge home and she was subsequently  discharged home for follow-up in the  office.   CONDITION ON DISCHARGE:  Improved.   DISCHARGE MEDICATIONS:  Percocet one to two q.6h. p.r.n. pain, Robaxin one  q.8h. p.r.n. spasm, Trinsicon one b.i.d. for anemia and Coumadin per  pharmacy protocol.  Follow-up in the office with Dr. Thomasena Edis in 2 weeks.   DISCHARGE INSTRUCTIONS:  She is to do her home exercises, do her home CPM,  call the office if any problems or questions arise.      Jaquelyn Bitter. Chabon, P.A.    ______________________________  Erasmo Leventhal, M.D.    SJC/MEDQ  D:  03/12/2006  T:  03/14/2006  Job:  [id number]

## 2010-10-25 NOTE — Consult Note (Signed)
Gail Mendez, Gail Mendez                   ACCOUNT NO.:  [id number]   MEDICAL RECORD NO.:  [id number]          PATIENT TYPE:  INP   LOCATION:  1513                         FACILITY:  [facility]   PHYSICIAN:  Hettie Holstein, D.O.    DATE OF BIRTH:  1956-02-28   DATE OF CONSULTATION:  02/23/2006  DATE OF DISCHARGE:                                   CONSULTATION   INTERNAL MEDICINE CONSULTATION   DATE OF CONSULTATION:  February 23, 2006   PRIMARY CARE PHYSICIAN:  [facility],  Dr. Providence Lanius.   REFERRING PHYSICIAN:  Erasmo Leventhal, M.D. or Orthopedic Surgery.   REASON FOR CONSULTATION:  Tachycardia and questionable urinary tract  infection.   HISTORY OF PRESENT ILLNESS:  Gail Mendez is a very pleasant 55 year old female  with known history of hypertension and history of deep venous thrombosis in  the past who is postoperative day #3 following left total knee arthroplasty  for end-stage osteoarthritis whose operative course was reportedly  uncomplicated.  She had been doing fairly well up until a few days ago when  she was noticed to have sustained tachycardia with heart rate ranging from  low 100's to 120's.  She does mention a prior history of possible irregular  heart rate.  Her baseline electrocardiogram during this hospitalization  reveals normal sinus rhythm.  I do not have a recent EKG when she started  developing the symptoms and are requesting one now.  In any event, she  complains of no symptoms at present, no chest pain or shortness of breath.  She is simply noted to have only tachycardia.  She did receive paraoperative  vancomycin as she does have PENICILLIN ALLERGY.   PAST MEDICAL HISTORY:  Significant for asthma, hypertension,  gastroesophageal reflux disease, hyperlipidemia.  She has had wrist surgery  as well as right heel and ankle surgery.  She has had a right knee  arthroplasty about 10 to 12 years ago.  She has a history of a deep  venous  thrombosis during her pregnancy.  She stated that she underwent treatment  with Warfarin.  She is status post appendectomy.  She states that she had to  have a repeat laparotomy for removal of retained surgical instrumentation.  She has a history of a Cesarean section.   MEDICATIONS AT HOME:  1. Baclofen 20 mg t.i.d.  2. Enalapril 5 mg q.h.s.  3. Lipitor 20 mg nightly.  4. Wellbutrin SR 150 mg p.o. q.a.m.  5. Omeprazole 20 mg p.o. q.a.m.  6. Albuterol 2 puffs q.4h. p.r.n.  7. Tylenol p.r.n.  She continues these medications while in the hospital in addition:  1. Colace.  2. Low molecular weight heparin.  3. Iron sulfate.  4. Coumadin.  5. She is taking Zocor in place of __________   ALLERGIES:  PENICILLIN.   FAMILY HISTORY:  She states that she is adopted but she is learning that  many of her siblings had early heart disease all before they were aged 43.  She states that she is the only one that  has not been diagnosed with heart  problems.  Her biological mother she states is in her 10's and developed  some heart problems in her 22's.  Her father died at age 11 with what she  describes as an aortic rupture.   SOCIAL HISTORY:  She smokes one-half pack of cigarettes a day and has been  doing this for greater than 30 years.  She denies alcohol.  She has  currently been placed on disability by Dr. Thomasena Edis.  She is currently  separated. She lives with one of her daughters.   REVIEW OF SYSTEMS:  HEENT:  She denies headaches, photophobia.  She denies  tinnitus.  GENERAL:  She denies constitutional symptoms stating that her  weight is stable, appetite is stable as well.  No fevers, chills or sweats  with the exception of that noted during those hospitalization, several days  ago, which has subsided.  CARDIOVASCULAR:  She denies palpitations or chest  pain.  There is some vague report of heart irregularity but she cannot  elaborate upon this.  She does not describe exertional  dyspnea though she  states she does not really exert herself.  PULMONARY:  She continues to  smoke.  She denies productive cough or shortness of breath.  GASTROINTESTINAL:  She reports occasional constipation, no blood nor coffee-  ground emesis.  No change in bowel habits or stool caliber.  GENITOURINARY:  She denies any urinary complaints.  ENDOCRINE:  She denies history of  diabetes or symptoms such as frequent thirst or frequent urination.  MUSCULOSKELETAL:  She has had multiple joint surgeries and is currently on  disability in reference to this.  She complains of joint pains in this  context.  NEUROLOGICAL:  She denies depression, anxiety, suicidal ideations.  Further comprehensive review of systems is negative.   PHYSICAL EXAMINATION:  VITAL SIGNS:  Reveal her heart rate to be 110.  She  had temperature of 99.  Temperature max of 101.3.  Respirations 20. Blood  pressure 116/74. Oxygen saturation 98% on 2L.  Past 24 hour ins and outs  4.1L/3.0L.  GENERAL APPEARANCE:  The patient is alert, oriented, very pleasant.  HEENT:  Head is normocephalic, atraumatic.  Extraocular movements intact.  NECK:  Supple, nontender, no palpitations thyromegaly or mass.  CARDIOVASCULAR:  Examination reveals normal S1 and S2.  There is a 2/6  systolic ejection murmur at the right sternal border without radiation to  the carotids.  She does have left sternal holosystolic murmur without  radiation to the axilla, this is about 2/6.  LUNGS:  Clear bilaterally with normal effort and no dullness to percussion.  ABDOMEN:  Soft, nontender.  No rebound or guarding.  No suprapubic or  costovertebral angle tenderness.  EXTREMITIES:  Reveal postoperative knee, left with clean, dry bandage.  Peripheral pulses are symmetrical and palpable bilaterally.  NEUROLOGICAL:  Reveals her to be euthymic. Her affect is stable.  There are  no focal deficits on examination.  LABORATORY DATA:  Sodium 136, potassium 3.8, BUN 8.0,  creatinine 0.78.  Glucose 124.  White blood cell count 9.8, hemoglobin 10.3, platelet count  214,000, MCV of 96.  Urinalysis is negative.  Electrocardiogram from  February 18, 2006 reveals normal sinus rhythm.  We do not have one  presently.   ASSESSMENT:  1. Postoperative day #3 total knee arthroplasty on the left.  2. Tachycardia/asymptomatic and unexplained.  3. History of deep venous thrombosis during pregnancy in the past.  4. Hypertension.  5. Strong  family history of heart disease.  6. Asthma, stable.  7. Hyperlipidemia.   PLAN:  Wound recommend checking a D-dimer as she does have a history of deep  venous thrombosis in the past.  If this is negative would stop further  evaluation here.  Otherwise would pursue a CT scan to rule out a pulmonary  embolus.  We will check an electrocardiogram as well as recommend outpatient  cardiac risk stratification on discharge if these studies prove to be  unremarkable and her tachycardia resolves.  I thank you for this  consultation.      Hettie Holstein, D.O.  Electronically Signed     ESS/MEDQ  D:  02/23/2006  T:  02/23/2006  Job:  [id number]   cc:   Devra Dopp, MD

## 2010-10-25 NOTE — Op Note (Signed)
Gail Mendez, Gail Mendez                   ACCOUNT NO.:  [id number]   MEDICAL RECORD NO.:  [id number]          PATIENT TYPE:  INP   LOCATION:  3172                         FACILITY:  [facility]   PHYSICIAN:  Kathaleen Maser. Pool, M.D.    DATE OF BIRTH:  11/04/1955   DATE OF PROCEDURE:  07/13/2006  DATE OF DISCHARGE:                               OPERATIVE REPORT   PREOPERATIVE DIAGNOSIS:  Left L4-5 stenosis/herniated nucleus pulposus  with radiculopathy.   POSTOPERATIVE DIAGNOSIS:  Left L4-5 stenosis/herniated nucleus pulposus  with radiculopathy.   PROCEDURE NOTE:  Left L4-5 decompressive laminotomy and foraminotomy  with microdiskectomy.   SURGEON:  Kathaleen Maser. Pool, M.D.   ASSISTANT:  Donalee Citrin, M.D.   ANESTHESIA:  General orotracheal anesthesia.   INDICATIONS FOR PROCEDURE:  Ms. Axtell is a 55 year old female with  history of left lower extremity pain, paresthesias and weakness  consistent with left-sided L5 radiculopathy, failing conservative.  Workup demonstrates evidence of a left-sided L4-5 disk herniation with  associated spondylosis and stenosis causing compression of left-sided L5  nerve root.  The patient presents now for decompressive laminotomy and  microdiskectomy in hopes of __________.   DESCRIPTION OF PROCEDURE:  Patient taken to the operating room and  placed on the table in the supine position.  After adequate level of  anesthesia was achieved, the patient positioned prone onto Wilson frame,  appropriately padded.  The patient lumbar regions prepped and draped  sterilely.  A 10 blade was used to make a linear skin incision overlying  the L4-5 interspace.  This was carried down sharply in midline where  subperiosteal dissection was then performed exposing the lamina of facet  joints L4-L5 on the left side.  Deep self-retaining retractor was  placed.  Intraoperative x-rays taken, level was confirmed.  Laminotomy  was then performed using high-speed drill and Kerrison rongeurs to  remove the inferior aspect the lamina of L4, medial aspect of L4-5 facet  joint and the superior rim of L5 lamina.  Ligament flavum was elevated  and resected in piecemeal fashion using Kerrison rongeurs, carried down  to the thecal sac and exiting L5 nerve root were identified.  Microscope  was brought into the field and used for microdissection, left-sided L5  nerve root and underlying disk herniation.  Epineural venous plexus  coagulated and cut.  Thecal sac and L5 nerve were mobilized, turned  towards midline.  Disk space and disk herniation were readily apparent.  Disk space then incised with a 15 blade in rectangular fashion.  Wide  disk space clean-out was then achieved using pituitary rongeurs, upward  angle pituitary rongeurs and Epstein curettes.  All elements of the disk  herniation were completely resected.  All loose or obviously  degenerative disc maternal was removed from the interspace.  At this  point a very thorough diskectomy had been achieved.  There was no  evidence of injury to thecal sac or nerve roots.  Wound was then  irrigated with antibiotic solution.  Gelfoam was placed topically for  hemostasis and found to be good.  Microscope and  retractor system were  removed.  Hemostasis of the muscles achieved with electrocautery.  Wound was then  closed in layers with Vicryl suture.  Steri-Strips and sterile dressing  were applied.  There were no apparent complications.  The patient  tolerated the procedure well and she returns to the recovery room for  postoperatively.           ______________________________  Kathaleen Maser Pool, M.D.     HAP/MEDQ  D:  07/13/2006  T:  07/13/2006  Job:  [id number]

## 2010-10-25 NOTE — Op Note (Signed)
Gail Mendez, Gail Mendez                   ACCOUNT NO.:  [id number]   MEDICAL RECORD NO.:  [id number]          PATIENT TYPE:  AMB   LOCATION:  DSC                          FACILITY:  [facility]   PHYSICIAN:  Cindee Salt, M.D.       DATE OF BIRTH:  1955/09/27   DATE OF PROCEDURE:  10/23/2005  DATE OF DISCHARGE:                                 OPERATIVE REPORT   PREOPERATIVE DIAGNOSIS:  Flexor carpi radialis tendinitis, cyst, left  scaphoid.   POSTOPERATIVE DIAGNOSIS:  Flexor carpi radialis tendinitis, cyst, left  scaphoid.   OPERATION:  Release of flexor carpi radialis with excision of cyst of left  scaphoid with DePuy bone graft.   SURGEON:  Cindee Salt, M.D.   ANESTHESIA:  General.   HISTORY:  The patient is a 55 year old female referred for wrist pain.  She  has a large cyst in her distal scaphoid occupying nearly the entire distal  pole. She has pain on the volar radial aspect of her wrist.  MRI reveals the  cyst position.  She is desirous of removal, was aware of risks and  complications. She is seen in the preoperative area where the procedure is  discussed, questions encouraged and answered.  The patient identified the  extremity and area of surgery marked.   PROCEDURE:  The patient was brought to the operating room where a general  anesthetic was carried out without difficulty.  She was prepped using  DuraPrep, supine position, left arm free.  The limb was exsanguinated with  an Esmarch bandage.  A tourniquet was placed high on the arm and was  inflated to 250 mmHg.  A volar incision was made over the flexor carpi  radialis carried slightly radial and then distally onto the thenar eminence,  carried down through subcutaneous tissue.  Bleeders were electrocauterized.  The palmar cutaneous branch of the median nerve was protected.  The  dissection was carried through the flexor carpi radialis sheath.  This was  then opened and released distally at the level of the trapezial ridge  releasing the trapezial ridge attachment of the flexor retinaculum.  The  tendon was then retracted ulnarly.  An incision was made in the base of the  flexor sheath until the distal pole of the scaphoid was identified.  This  was then placed under the image intensifier and the cyst localized. Drill  holes were placed with a 62 K-wire. These were connected and a window  removed from the volar aspect of the scaphoid tubercle.  The cyst was then  curetted, the wall of the cyst was sent to pathology.  This revealed the  entire distal pole of the scaphoid being involved. This was confirmed with  OEC placing a marker.  A DePuy bone graft substitute was then mixed after  irrigation of the cyst.  This was then packed into the cyst.  X-rays  confirmed filling of the cyst.  The wound was irrigated.  The flexor carpi  radialis sheath was then repaired over the distal pole of the scaphoid with  figure-of-eight 4-0 Vicryl sutures,  the subcutaneous tissue with 4-0 Vicryl,  and the skin with interrupted 5-0 nylon  sutures.  A sterile compressive dressing and thumb spica splint was applied.  The patient tolerated the procedure well and was taken to the recovery room  for observation in satisfactory condition.  She is discharged home to return  to the [facility] in one week on Vicodin.           ______________________________  Cindee Salt, M.D.     GK/MEDQ  D:  10/23/2005  T:  10/23/2005  Job:  [id number]   cc:   Dr. Darrick Penna

## 2010-10-25 NOTE — Op Note (Signed)
NAME:  Gail Mendez, Gail Mendez                             ACCOUNT NO.:  [id number]   MEDICAL RECORD NO.:  [id number]                   PATIENT TYPE:  AMB   LOCATION:  DSC                                  FACILITY:  [facility]   PHYSICIAN:  Leonides Grills, M.D.                  DATE OF BIRTH:  1955/09/10   DATE OF PROCEDURE:  03/14/2003  DATE OF DISCHARGE:                                 OPERATIVE REPORT   PREOPERATIVE DIAGNOSES:  1. Right subtalar arthritis.  2. Right talar spurs.  3. Right calcaneal spurs.  4. Right tight Achilles tendon.   POSTOPERATIVE DIAGNOSES:  1. Right subtalar arthritis.  2. Right talar spurs.  3. Right calcaneal spurs.  4. Right tight Achilles tendon.   OPERATION:  1. Right subtalar fusion.  2. Right iliac crest bone graft.  3. Right excision, lateral calcaneal spurs.  4. Right excision, talar spurs.  5. Right percutaneous tendo Achilles lengthening.  6. Stress x-rays, right foot.   ANESTHESIA:  General endotracheal tube.   SURGEON:  Leonides Grills, M.D.   ASSISTANT:  Lianne Cure, P.A.   ESTIMATED BLOOD LOSS:  Minimal.   TOURNIQUET TIME:  Approximately an hour and a half.   COMPLICATIONS:  None.   DISPOSITION:  Stable to the PAR.   INDICATIONS:  This is a 55 year old female who has had longstanding right  subtalar arthritis with pain that has been interfering with her life to the  point that she cannot do what she wants to do.  She was consented for the  above procedure.  All risks, which include infection, nerve or vessel  injury, bleeding, nonunion, malunion, hardware rotation, hardware failure,  persistent pain, worsening pain, stiffness, arthritis of the neighboring  joints with possible future fusion, were all explained, questions were  encouraged and answered.   OPERATION:  The patient was brought to the operating room and placed in the  supine position initially after adequate general endotracheal tube was  administered as well as vancomycin  500 mg IV piggyback.  The patient was  then placed in the lateral decubitus position with the operative side up and  the right iliac crest bone graft site as well as the right foot were prepped  and draped in a sterile manner over a proximally-placed thigh tourniquet.  We started the procedure with two medial and one lateral hemisections to the  Achilles tendon.  This had an excellent release of the tight Achilles  tendon.  Once this was done, we then went through the previous incision she  had, an Ollier-type incision.  Dissection was carried down through the skin.  A soft tissue flap was then elevated off the calcaneous.  There was an  enormous spur laterally and off the talus and calcaneus that was causing  subfibular impingement.  This was then removed with a curved quarter-inch  osteotome and saved for the  back table for further bone-grafting.  We then  under C-arm guidance entered the subtalar joint. Once this was entered and  verified under C-arm guidance in the lateral plane and stress x-rays were  obtained to show the joint was open, we then removed the remaining cartilage  with the curved quarter-inch osteotome and curette.  Once this was done,  multiple 2 mm drill holes were placed on either side of the joint with the  visualization aid of a small laminar spreader.  Once this was done and the  joint was prepared, there was no significant deformity to the subtalar joint  so there was no need to do any correction.  She did not have any significant  anterior ankle symptoms, so we did not need to do a subtalar distraction  bone block fusion.  It was at this point we decided to use a Ignite bone  graft, BMP with iliac crest bone graft mixture.  We then through the sterile  site at the iliac crest bone graft, dissection was carried down to the crest  with 15 blade scalpel. Then a trocar was placed in the iliac crest and  tamped into place after palpating either side of the table and  making sure  that it was centered.  We then removed between 15 and 20 mL of bone marrow  aspirate.  This was then mixed on the back table with BMP-Ignite mixture,  and the local bone graft obtained from the talus and calcaneus as well as  the Ignite mixture were then placed into the subtalar joint, and prior to  this being done the subtalar joint was copiously irrigated with normal  saline.  The laminar spreader was then removed and one 6.5 mm, 16 mm  threaded cancellous screw was then placed from the calcaneus into the talus  through a stab wound longitudinally midline in the heel using 4.2 and 3.2 mm  drill holes, respectively.  This had excellent compression across the  arthrodesis site.  This was visualized under C-arm guidance in the AP angle  and lateral planes of the ankle to be in excellent position.  The wounds  were copiously irrigated with normal saline.  The tourniquet was deflated.  Hemostasis obtained.  The skin was closed with a 4-0 nylon over all wounds.  A sterile dressing was applied.  A modified Jones dressing was applied with  the ankle in neutral dorsiflexion.  The patient was stable to the PAR.                                                Leonides Grills, M.D.    PB/MEDQ  D:  03/14/2003  T:  03/15/2003  Job:  [id number]

## 2011-03-21 ENCOUNTER — Encounter: Payer: Self-pay | Admitting: *Deleted

## 2011-03-21 ENCOUNTER — Emergency Department (HOSPITAL_BASED_OUTPATIENT_CLINIC_OR_DEPARTMENT_OTHER)
Admission: EM | Admit: 2011-03-21 | Discharge: 2011-03-21 | Disposition: A | Discharge status: Home / Self Care | Payer: Medicare Other | Attending: Emergency Medicine | Admitting: Emergency Medicine

## 2011-03-21 ENCOUNTER — Emergency Department (INDEPENDENT_AMBULATORY_CARE_PROVIDER_SITE_OTHER): Payer: Medicare Other

## 2011-03-21 DIAGNOSIS — K219 Gastro-esophageal reflux disease without esophagitis: Secondary | ICD-10-CM | POA: Insufficient documentation

## 2011-03-21 DIAGNOSIS — F17200 Nicotine dependence, unspecified, uncomplicated: Secondary | ICD-10-CM | POA: Insufficient documentation

## 2011-03-21 DIAGNOSIS — S9030XA Contusion of unspecified foot, initial encounter: Secondary | ICD-10-CM | POA: Insufficient documentation

## 2011-03-21 DIAGNOSIS — M24873 Other specific joint derangements of unspecified ankle, not elsewhere classified: Secondary | ICD-10-CM

## 2011-03-21 DIAGNOSIS — W010XXA Fall on same level from slipping, tripping and stumbling without subsequent striking against object, initial encounter: Secondary | ICD-10-CM

## 2011-03-21 DIAGNOSIS — M25579 Pain in unspecified ankle and joints of unspecified foot: Secondary | ICD-10-CM

## 2011-03-21 DIAGNOSIS — Y92009 Unspecified place in unspecified non-institutional (private) residence as the place of occurrence of the external cause: Secondary | ICD-10-CM | POA: Insufficient documentation

## 2011-03-21 DIAGNOSIS — IMO0002 Reserved for concepts with insufficient information to code with codable children: Secondary | ICD-10-CM | POA: Insufficient documentation

## 2011-03-21 HISTORY — DX: Other chronic pain: G89.29

## 2011-03-21 HISTORY — DX: Dorsalgia, unspecified: M54.9

## 2011-03-21 HISTORY — DX: Gastro-esophageal reflux disease without esophagitis: K21.9

## 2011-03-21 MED ORDER — HYDROCODONE-ACETAMINOPHEN 5-325 MG PO TABS: 2.0000 | ORAL_TABLET | ORAL | Status: AC | PRN

## 2011-03-21 NOTE — ED Notes (Signed)
Pt. Walking with post op shoe

## 2011-03-21 NOTE — ED Provider Notes (Signed)
History     CSN: [id number] Arrival date & time: 03/21/2011  7:59 PM  Chief Complaint  Patient presents with  . Foot Pain    (Consider location/radiation/quality/duration/timing/severity/associated sxs/prior treatment) Patient is a 55 y.o. female presenting with foot injury. The history is provided by the patient.  Foot Injury  The incident occurred more than 1 week ago. The incident occurred at home. The pain is present in the left foot. The quality of the pain is described as aching. The pain is at a severity of 5/10. The pain is moderate. The pain has been constant since onset. Pertinent negatives include no numbness and no inability to bear weight. She reports no foreign bodies present. She has tried nothing for the symptoms. The treatment provided moderate relief.  Pt reports she hit foot 2 weeks ago.  Pt reports foot is still swollen and painful.  Past Medical History  Diagnosis Date  . GERD (gastroesophageal reflux disease)   . Chronic back pain     Past Surgical History  Procedure Date  . Total knee arthroplasty   . Tonsillectomy   . Appendectomy     No family history on file.  History  Substance Use Topics  . Smoking status: Current Everyday Smoker  . Smokeless tobacco: Not on file  . Alcohol Use: No    OB History    Grav Para Term Preterm Abortions TAB SAB Ect Mult Living                  Review of Systems  Musculoskeletal: Positive for joint swelling.  Neurological: Negative for numbness.  All other systems reviewed and are negative.    Allergies  Penicillins  Home Medications   Current Outpatient Rx  Name Route Sig Dispense Refill  . ALBUTEROL 90 MCG/ACT IN AERS Inhalation Inhale 2 puffs into the lungs every 4 (four) hours as needed. For wheezing     . BACLOFEN 20 MG PO TABS Oral Take 20 mg by mouth 3 (three) times daily as needed. For back spasms     . OMEPRAZOLE 20 MG PO CPDR  1 CAPSULE DAILY FOR HEARTBURN 90 capsule 1  . SIMVASTATIN 40 MG  PO TABS  TAKE ONE TABLET BY MOUTH DAILY FOR CHOLESTEROL 90 tablet 1  . TRIAMCINOLONE ACETONIDE 0.1 % EX CREA Topical Apply topically 2 (two) times daily as needed.        BP 139/69  Pulse 83  Temp(Src) 98.9 F (37.2 C) (Oral)  Resp 17  Ht 4\' 10"  (1.473 m)  Wt 165 lb (74.844 kg)  BMI 34.49 kg/m2  SpO2 97%  Physical Exam  Nursing note and vitals reviewed. Constitutional: She appears well-developed and well-nourished.  HENT:  Head: Normocephalic.  Eyes: Pupils are equal, round, and reactive to light.  Musculoskeletal: She exhibits edema and tenderness.       Swollen tender distal left foot,  Decreased range of motion,  nv and ns intact  Neurological: She is alert.  Skin: Skin is warm.  Psychiatric: She has a normal mood and affect.    ED Course  Procedures (including critical care time)  Labs Reviewed - No data to display Dg Foot Complete Left  03/21/2011  *RADIOLOGY REPORT*  Clinical Data: Tripped/hyperextended left foot, pain  LEFT FOOT - COMPLETE 3+ VIEW  Comparison: None.  Findings: No fracture or dislocation is seen.  Deformity/degenerative changes of the mid/hindfoot.  Mild soft tissue edema.  IMPRESSION: No fracture or dislocation is seen.  Deformity/degenerative changes of  the mid/hindfoot.  Original Report Authenticated By: Charline Bills, M.D.     No diagnosis found.    MDM  No fx,  Pt advised to follow up with her Orthopaedist.  Pt placed in a post op shoe and wrapped.        Langston Masker, [place] 03/21/11 2105

## 2011-03-21 NOTE — ED Notes (Signed)
Pt c/o left foot pain x2 weeks. Pt sts she rolled her foot under while walking 2 weeks ago and the same is still swollen, reddened and painful.

## 2011-03-22 NOTE — ED Provider Notes (Signed)
Medical screening examination/treatment/procedure(s) were performed by non-physician practitioner and as supervising physician I was immediately available for consultation/collaboration.   Hanley Seamen, MD 03/22/11 [phone]

## 2011-03-25 ENCOUNTER — Other Ambulatory Visit: Payer: Self-pay | Admitting: Family Medicine

## 2011-03-25 NOTE — Telephone Encounter (Signed)
Refill request

## 2011-10-08 ENCOUNTER — Other Ambulatory Visit: Payer: Self-pay | Admitting: Family Medicine

## 2012-04-05 ENCOUNTER — Other Ambulatory Visit: Payer: Self-pay | Admitting: *Deleted

## 2012-04-05 MED ORDER — OMEPRAZOLE 20 MG PO CPDR: 20.0000 mg | DELAYED_RELEASE_CAPSULE | Freq: Every day | ORAL | Status: DC

## 2012-06-25 ENCOUNTER — Encounter: Payer: Self-pay | Admitting: Family Medicine

## 2012-06-25 ENCOUNTER — Ambulatory Visit (INDEPENDENT_AMBULATORY_CARE_PROVIDER_SITE_OTHER): Payer: Medicare Other | Admitting: Family Medicine

## 2012-06-25 VITALS — BP 133/71 | HR 87 | Temp 98.2°F | Ht 59.0 in | Wt 158.6 lb

## 2012-06-25 DIAGNOSIS — E7800 Pure hypercholesterolemia, unspecified: Secondary | ICD-10-CM

## 2012-06-25 DIAGNOSIS — L919 Hypertrophic disorder of the skin, unspecified: Secondary | ICD-10-CM

## 2012-06-25 DIAGNOSIS — M549 Dorsalgia, unspecified: Secondary | ICD-10-CM

## 2012-06-25 DIAGNOSIS — K219 Gastro-esophageal reflux disease without esophagitis: Secondary | ICD-10-CM

## 2012-06-25 DIAGNOSIS — L918 Other hypertrophic disorders of the skin: Secondary | ICD-10-CM

## 2012-06-25 DIAGNOSIS — J45909 Unspecified asthma, uncomplicated: Secondary | ICD-10-CM

## 2012-06-25 DIAGNOSIS — F17200 Nicotine dependence, unspecified, uncomplicated: Secondary | ICD-10-CM

## 2012-06-25 DIAGNOSIS — I10 Essential (primary) hypertension: Secondary | ICD-10-CM

## 2012-06-25 DIAGNOSIS — E785 Hyperlipidemia, unspecified: Secondary | ICD-10-CM

## 2012-06-25 LAB — COMPREHENSIVE METABOLIC PANEL
AST: 13 U/L (ref 0–37)
Alkaline Phosphatase: 67 U/L (ref 39–117)
BUN: 10 mg/dL (ref 6–23)
Glucose, Bld: 97 mg/dL (ref 70–99)
Sodium: 141 mEq/L (ref 135–145)
Total Bilirubin: 0.4 mg/dL (ref 0.3–1.2)
Total Protein: 6.7 g/dL (ref 6.0–8.3)

## 2012-06-25 LAB — CBC
Hemoglobin: 15.6 g/dL — ABNORMAL HIGH (ref 12.0–15.0)
MCH: 32.6 pg (ref 26.0–34.0)
MCHC: 34.7 g/dL (ref 30.0–36.0)
MCV: 93.9 fL (ref 78.0–100.0)
RBC: 4.79 MIL/uL (ref 3.87–5.11)

## 2012-06-25 MED ORDER — OMEPRAZOLE 20 MG PO CPDR: 20.0000 mg | DELAYED_RELEASE_CAPSULE | Freq: Every day | ORAL | Status: DC

## 2012-06-25 MED ORDER — TRIAMCINOLONE ACETONIDE 0.1 % EX CREA: TOPICAL_CREAM | Freq: Two times a day (BID) | CUTANEOUS | Status: AC | PRN

## 2012-06-25 MED ORDER — ALBUTEROL SULFATE HFA 108 (90 BASE) MCG/ACT IN AERS: 2.0000 | INHALATION_SPRAY | Freq: Four times a day (QID) | RESPIRATORY_TRACT | Status: DC | PRN

## 2012-06-25 MED ORDER — BACLOFEN 20 MG PO TABS: 20.0000 mg | ORAL_TABLET | Freq: Three times a day (TID) | ORAL | Status: DC | PRN

## 2012-06-25 MED ORDER — SIMVASTATIN 40 MG PO TABS: 40.0000 mg | ORAL_TABLET | Freq: Every day | ORAL | Status: DC

## 2012-06-25 NOTE — Assessment & Plan Note (Signed)
Wrote note for jury duty exclusion today.

## 2012-06-25 NOTE — Assessment & Plan Note (Signed)
Refill of PPI today.  No worrisome signs or symptoms on examination or by history.

## 2012-06-25 NOTE — Assessment & Plan Note (Signed)
CMET and direct LDL today.  Last was in 2010.   Refill of statin, will call if need to change dosage.  Tolerating medication well

## 2012-06-25 NOTE — Patient Instructions (Addendum)
We will refer you to a dermatologist for the skin tag on your eyelid.    We or they can remove the spot on your arm.   Blood work today.  Refills done.

## 2012-06-25 NOTE — Assessment & Plan Note (Signed)
Refill of Albuterol provided.  No refills to judge when she needs a refill and how often she is using it.

## 2012-06-25 NOTE — Progress Notes (Signed)
Patient ID: Gail Mendez, female   DOB: 1955/10/18, 57 y.o.   MRN: [id number] Gail Mendez is a 57 y.o. female with PMH significant for chronic back pain stemming from complications from spinal surgery, HLD, GERD, and intermittent asthma who presents to [facility] today for complete physical exam.  She also has the concerns listed below:    1.  Mole on eyelid:  Has had growth on Right eyelid for years.  Gradually enlarging.  Starting to obstruct her vision.  No itching, bleeding, or pain.  Would like it removed.  2.  HLD:  Last lipid panel listed below.    Currently is on Statin.  Denies any myalgias, icterus, jaundice.  Tolerating medications well.   Lab Results  Component Value Date   CHOL 188 07/25/2008   CHOL 141 03/19/2007   Lab Results  Component Value Date   HDL 35* 07/25/2008   HDL 38* 03/19/2007   Lab Results  Component Value Date   LDLCALC 82 07/25/2008   LDLCALC 56 03/19/2007   Lab Results  Component Value Date   TRIG 355* 07/25/2008   TRIG 235* 03/19/2007   Lab Results  Component Value Date   CHOLHDL 5.4 Ratio 07/25/2008   CHOLHDL 3.7 Ratio 03/19/2007   3.  GERD:  Patient complains of eructation, bloating, and brash meals for past several years.  Worse if she doesn't take PPI.  Has been on daily PPI for years.   Has tried trials off meds or only H2 blockers, has recurrence of symptoms when off PPI.  ROS: patient denies abdominal pain, weight loss, dysphagia, hematemesis, history of PUD or history of GI bleeding. Social history: no or minimal alcohol.  The following portions of the patient's history were reviewed and updated as appropriate: allergies, current medications, past medical history, family and social history, and problem list.  Patient is a current everyday smoker.  Past Medical History  Diagnosis Date  . GERD (gastroesophageal reflux disease)   . Chronic back pain     ROS as above otherwise neg. No Chest pain, palpitations, SOB, Fever, Chills, Abd pain, N/V/D.    Medications reviewed. Current Outpatient Prescriptions  Medication Sig Dispense Refill  . albuterol (PROVENTIL HFA;VENTOLIN HFA) 108 (90 BASE) MCG/ACT inhaler Inhale 2 puffs into the lungs every 6 (six) hours as needed for wheezing.  1 Inhaler  0  . baclofen (LIORESAL) 20 MG tablet Take 1 tablet (20 mg total) by mouth 3 (three) times daily as needed. For back spasms  90 each  3  . omeprazole (PRILOSEC) 20 MG capsule Take 1 capsule (20 mg total) by mouth daily.  90 capsule  1  . simvastatin (ZOCOR) 40 MG tablet Take 1 tablet (40 mg total) by mouth at bedtime.  90 tablet  1  . triamcinolone cream (KENALOG) 0.1 % Apply topically 2 (two) times daily as needed. Apply to patches on arm for itching.  May thin skin.  45 g  3    Exam:  BP 133/71  Pulse 87  Temp 98.2 F (36.8 C) (Oral)  Ht 4\' 11"  (1.499 m)  Wt 158 lb 9.6 oz (71.94 kg)  BMI 32.03 kg/m2 Gen:  Alert, cooperative patient who appears stated age in no acute distress.  Vital signs reviewed. HEENT:  [place]/AT.  EOMI, PERRL.  MMM, tonsils non-erythematous, non-edematous.  External ears WNL, Bilateral TM's normal without retraction, redness or bulging.  Lungs: CTABL Nl WOB Heart: RRR, Grade II/VI SEM noted BL upper sternal borders Abd: NABS,  NT, ND Exts: Non edematous BL  LE, warm and well perfused.  Neuro:  No focal findings.  Ambulates slowly due to back and leg pain. Skin:  0.9 mm skin tag extending from Right eyelid.  Also with similar length tag lateral aspect of Left eye.  Has 1 cm scaly patch noted dorsal aspect of Right arm.    No results found for this or any previous visit (from the past 72 hour(s)).

## 2012-06-25 NOTE — Assessment & Plan Note (Signed)
At goal today, not on any medications.

## 2012-06-25 NOTE — Assessment & Plan Note (Signed)
Counseled to quit completely 

## 2012-06-28 ENCOUNTER — Encounter: Payer: Self-pay | Admitting: Family Medicine

## 2012-10-07 ENCOUNTER — Other Ambulatory Visit: Payer: Self-pay | Admitting: Family Medicine

## 2012-11-04 ENCOUNTER — Other Ambulatory Visit: Payer: Self-pay | Admitting: Family Medicine

## 2012-12-02 ENCOUNTER — Other Ambulatory Visit: Payer: Self-pay | Admitting: *Deleted

## 2012-12-02 MED ORDER — BACLOFEN 20 MG PO TABS: ORAL_TABLET | ORAL | Status: DC

## 2013-01-05 ENCOUNTER — Other Ambulatory Visit: Payer: Self-pay | Admitting: Family Medicine

## 2013-05-03 ENCOUNTER — Other Ambulatory Visit: Payer: Self-pay | Admitting: Family Medicine

## 2013-06-08 ENCOUNTER — Other Ambulatory Visit: Payer: Self-pay | Admitting: Family Medicine

## 2013-07-07 ENCOUNTER — Other Ambulatory Visit: Payer: Self-pay | Admitting: Family Medicine

## 2013-08-03 ENCOUNTER — Other Ambulatory Visit: Payer: Self-pay | Admitting: Family Medicine

## 2013-10-04 ENCOUNTER — Other Ambulatory Visit: Payer: Self-pay | Admitting: Family Medicine

## 2013-11-02 ENCOUNTER — Other Ambulatory Visit: Payer: Self-pay | Admitting: Family Medicine

## 2013-12-26 ENCOUNTER — Ambulatory Visit (HOSPITAL_COMMUNITY)
Admission: RE | Admit: 2013-12-26 | Discharge: 2013-12-26 | Disposition: A | Discharge status: Home / Self Care | Payer: Medicare Other | Source: Ambulatory Visit | Attending: Cardiology | Admitting: Cardiology

## 2013-12-26 ENCOUNTER — Ambulatory Visit (INDEPENDENT_AMBULATORY_CARE_PROVIDER_SITE_OTHER): Payer: Medicare Other | Admitting: Family Medicine

## 2013-12-26 ENCOUNTER — Encounter: Payer: Self-pay | Admitting: Family Medicine

## 2013-12-26 VITALS — BP 145/83 | HR 87 | Temp 98.2°F | Wt 189.9 lb

## 2013-12-26 DIAGNOSIS — M7989 Other specified soft tissue disorders: Secondary | ICD-10-CM | POA: Insufficient documentation

## 2013-12-26 DIAGNOSIS — M79609 Pain in unspecified limb: Secondary | ICD-10-CM

## 2013-12-26 LAB — CBC WITH DIFFERENTIAL/PLATELET
BASOS ABS: 0 10*3/uL (ref 0.0–0.1)
Basophils Relative: 0 % (ref 0–1)
Eosinophils Absolute: 0.2 10*3/uL (ref 0.0–0.7)
Eosinophils Relative: 2 % (ref 0–5)
HCT: 40.4 % (ref 36.0–46.0)
Hemoglobin: 13.4 g/dL (ref 12.0–15.0)
LYMPHS ABS: 3.4 10*3/uL (ref 0.7–4.0)
LYMPHS PCT: 43 % (ref 12–46)
MCH: 31.5 pg (ref 26.0–34.0)
MCHC: 33.2 g/dL (ref 30.0–36.0)
MCV: 94.8 fL (ref 78.0–100.0)
Monocytes Absolute: 0.6 10*3/uL (ref 0.1–1.0)
Monocytes Relative: 8 % (ref 3–12)
NEUTROS ABS: 3.7 10*3/uL (ref 1.7–7.7)
NEUTROS PCT: 47 % (ref 43–77)
PLATELETS: 228 10*3/uL (ref 150–400)
RBC: 4.26 MIL/uL (ref 3.87–5.11)
RDW: 13.2 % (ref 11.5–15.5)
WBC: 7.9 10*3/uL (ref 4.0–10.5)

## 2013-12-26 MED ORDER — DOXYCYCLINE HYCLATE 100 MG PO TABS: 100.0000 mg | ORAL_TABLET | Freq: Two times a day (BID) | ORAL | Status: AC

## 2013-12-26 NOTE — Assessment & Plan Note (Addendum)
Concern for cellulitis versus hardware infection versus DVT. CBC today and venous Doppler ultrasound of the left leg. If infectious we'll start antibiotics, patient is allergic to penicillin. If DVT patient will need to be counseled on blood thinners. She prefers a pill, therefore may benefit from warfarin or Xarelto. Followup in one to 1 weeks regardless  Update:  Ultrasound negative. Start patient on doxycycline 100 mg twice a day. Red flags discussed with patient and will need to followup on Friday.

## 2013-12-26 NOTE — Patient Instructions (Signed)
It is likely either have a blood clot or a skin infection. We will start with doing a ultrasound of your leg to rule out any type of blood clot. If you do have a blood clot we will need to have you come back in and have a discussion on blood thinners. In addition I would like to perform a CBC lab, to rule out any type of infection.  Deep Vein Thrombosis A deep vein thrombosis (DVT) is a blood clot that develops in the deep, larger veins of the leg, arm, or pelvis. These are more dangerous than clots that might form in veins near the surface of the body. A DVT can lead to complications if the clot breaks off and travels in the bloodstream to the lungs.  A DVT can damage the valves in your leg veins, so that instead of flowing upward, the blood pools in the lower leg. This is called post-thrombotic syndrome, and it can result in pain, swelling, discoloration, and sores on the leg. CAUSES Usually, several things contribute to blood clots forming. Contributing factors include:  The flow of blood slows down.  The inside of the vein is damaged in some way.  You have a condition that makes blood clot more easily. RISK FACTORS Some people are more likely than others to develop blood clots. Risk factors include:   Older age, especially over 61 years of age.  Having a family history of blood clots or if you have already had a blot clot.  Having major or lengthy surgery. This is especially true for surgery on the hip, knee, or belly (abdomen). Hip surgery is particularly high risk.  Breaking a hip or leg.  Sitting or lying still for a long time. This includes long-distance travel, paralysis, or recovery from an illness or surgery.  Having cancer or cancer treatment.  Having a long, thin tube (catheter) placed inside a vein during a medical procedure.  Being overweight (obese).  Pregnancy and childbirth.  Hormone changes make the blood clot more easily during pregnancy.  The fetus puts  pressure on the veins of the pelvis.  There is a risk of injury to veins during delivery or a caesarean. The risk is highest just after childbirth.  Medicines with the female hormone estrogen. This includes birth control pills and hormone replacement therapy.  Smoking.  Other circulation or heart problems.  SIGNS AND SYMPTOMS When a clot forms, it can either partially or totally block the blood flow in that vein. Symptoms of a DVT can include:  Swelling of the leg or arm, especially if one side is much worse.  Warmth and redness of the leg or arm, especially if one side is much worse.  Pain in an arm or leg. If the clot is in the leg, symptoms may be more noticeable or worse when standing or walking. The symptoms of a DVT that has traveled to the lungs (pulmonary embolism, PE) usually start suddenly and include:  Shortness of breath.  Coughing.  Coughing up blood or blood-tinged phlegm.  Chest pain. The chest pain is often worse with deep breaths.  Rapid heartbeat. Anyone with these symptoms should get emergency medical treatment right away. Call your local emergency services (911 in the [place] if you have these symptoms. DIAGNOSIS If a DVT is suspected, your health care provider will take a full medical history and perform a physical exam. Tests that also may be required include:  Blood tests, including studies of the clotting properties of the  blood.  Ultrasonography to see if you have clots in your legs or lungs.  X-rays to show the flow of blood when dye is injected into the veins (venography).  Studies of your lungs if you have any chest symptoms. PREVENTION  Exercise the legs regularly. Take a brisk 30-minute walk every day.  Maintain a weight that is appropriate for your height.  Avoid sitting or lying in bed for long periods of time without moving your legs.  Women, particularly those over the age of 35 years, should consider the risks and benefits of taking  estrogen medicines, including birth control pills.  Do not smoke, especially if you take estrogen medicines.  Long-distance travel can increase your risk of DVT. You should exercise your legs by walking or pumping the muscles every hour.  In-hospital prevention:  Many of the risk factors above relate to situations that exist with hospitalization, either for illness, injury, or elective surgery.  Your health care provider will assess you for the need for venous thromboembolism prophylaxis when you are admitted to the hospital. If you are having surgery, your surgeon will assess you the day of or day after surgery.  Prevention may include medical and nonmedical measures. TREATMENT Once identified, a DVT can be treated. It can also be prevented in some circumstances. Once you have had a DVT, you may be at increased risk for a DVT in the future. The most common treatment for DVT is blood thinning (anticoagulant) medicine, which reduces the blood's tendency to clot. Anticoagulants can stop new blood clots from forming and stop old ones from growing. They cannot dissolve existing clots. Your body does this by itself over time. Anticoagulants can be given by mouth, by IV access, or by injection. Your health care provider will determine the best program for you. Other medicines or treatments that may be used are:  Heparin or related medicines (low molecular weight heparin) are usually the first treatment for a blood clot. They act quickly. However, they cannot be taken orally.  Heparin can cause a fall in a component of blood that stops bleeding and forms blood clots (platelets). You will be monitored with blood tests to be sure this does not occur.  Warfarin is an anticoagulant that can be swallowed. It takes a few days to start working, so usually heparin or related medicines are used in combination. Once warfarin is working, heparin is usually stopped.  Less commonly, clot dissolving drugs  (thrombolytics) are used to dissolve a DVT. They carry a high risk of bleeding, so they are used mainly in severe cases, where your life or a limb is threatened.  Very rarely, a blood clot in the leg needs to be removed surgically.  If you are unable to take anticoagulants, your health care provider may arrange for you to have a filter placed in a main vein in your abdomen. This filter prevents clots from traveling to your lungs. HOME CARE INSTRUCTIONS  Take all medicines prescribed by your health care provider. Only take over-the-counter or prescription medicines for pain, fever, or discomfort as directed by your health care provider.  Warfarin. Most people will continue taking warfarin after hospital discharge. Your health care provider will advise you on the length of treatment (usually 3-6 months, sometimes lifelong).  Too much and too little warfarin are both dangerous. Too much warfarin increases the risk of bleeding. Too little warfarin continues to allow the risk for blood clots. While taking warfarin, you will need to have regular blood  tests to measure your blood clotting time. These blood tests usually include both the prothrombin time (PT) and international normalized ratio (INR) tests. The PT and INR results allow your health care provider to adjust your dose of warfarin. The dose can change for many reasons. It is critically important that you take warfarin exactly as prescribed, and that you have your PT and INR levels drawn exactly as directed.  Many foods, especially foods high in vitamin K, can interfere with warfarin and affect the PT and INR results. Foods high in vitamin K include spinach, kale, broccoli, cabbage, collard and turnip greens, brussel sprouts, peas, cauliflower, seaweed, and parsley as well as beef and pork liver, green tea, and soybean oil. You should eat a consistent amount of foods high in vitamin K. Avoid major changes in your diet, or notify your health care  provider before changing your diet. Arrange a visit with a dietitian to answer your questions.  Many medicines can interfere with warfarin and affect the PT and INR results. You must tell your health care provider about any and all medicines you take. This includes all vitamins and supplements. Be especially cautious with aspirin and anti-inflammatory medicines. Ask your health care provider before taking these. Do not take or discontinue any prescribed or over-the-counter medicine except on the advice of your health care provider or pharmacist.  Warfarin can have side effects, primarily excessive bruising or bleeding. You will need to hold pressure over cuts for longer than usual. Your health care provider or pharmacist will discuss other potential side effects.  Alcohol can change the body's ability to handle warfarin. It is best to avoid alcoholic drinks or consume only very small amounts while taking warfarin. Notify your health care provider if you change your alcohol intake.  Notify your dentist or other health care providers before procedures.  Activity. Ask your health care provider how soon you can go back to normal activities. It is important to stay active to prevent blood clots. If you are on anticoagulant medicine, avoid contact sports.  Exercise. It is very important to exercise. This is especially important while traveling, sitting, or standing for long periods of time. Exercise your legs by walking or by pumping the muscles frequently. Take frequent walks.  Compression stockings. These are tight elastic stockings that apply pressure to the lower legs. This pressure can help keep the blood in the legs from clotting. You may need to wear compression stockings at home to help prevent a DVT.  Do not smoke. If you smoke, quit. Ask your health care provider for help with quitting smoking.  Learn as much as you can about DVT. Knowing more about the condition should help you keep it from  coming back.  Wear a medical alert bracelet or carry a medical alert card. SEEK MEDICAL CARE IF:  You notice a rapid heartbeat.  You feel weaker or more tired than usual.  You feel faint.  You notice increased bruising.  You feel your symptoms are not getting better in the time expected.  You believe you are having side effects of medicine. SEEK IMMEDIATE MEDICAL CARE IF:  You have chest pain.  You have trouble breathing.  You have new or increased swelling or pain in one leg.  You cough up blood.  You notice blood in vomit, in a bowel movement, or in urine. MAKE SURE YOU:  Understand these instructions.  Will watch your condition.  Will get help right away if you are not doing  well or get worse. Document Released: 05/26/2005 Document Revised: 03/16/2013 Document Reviewed: 01/31/2013 [facility] Patient Information 2015 [place], [place]. This information is not intended to replace advice given to you by your health care provider. Make sure you discuss any questions you have with your health care provider.   Cellulitis Cellulitis is an infection of the skin and the tissue beneath it. The infected area is usually red and tender. Cellulitis occurs most often in the arms and lower legs.  CAUSES  Cellulitis is caused by bacteria that enter the skin through cracks or cuts in the skin. The most common types of bacteria that cause cellulitis are Staphylococcus and Streptococcus. SYMPTOMS   Redness and warmth.  Swelling.  Tenderness or pain.  Fever. DIAGNOSIS  Your caregiver can usually determine what is wrong based on a physical exam. Blood tests may also be done. TREATMENT  Treatment usually involves taking an antibiotic medicine. HOME CARE INSTRUCTIONS   Take your antibiotics as directed. Finish them even if you start to feel better.  Keep the infected arm or leg elevated to reduce swelling.  Apply a warm cloth to the affected area up to 4 times per day to relieve  pain.  Only take over-the-counter or prescription medicines for pain, discomfort, or fever as directed by your caregiver.  Keep all follow-up appointments as directed by your caregiver. SEEK MEDICAL CARE IF:   You notice red streaks coming from the infected area.  Your red area gets larger or turns dark in color.  Your bone or joint underneath the infected area becomes painful after the skin has healed.  Your infection returns in the same area or another area.  You notice a swollen bump in the infected area.  You develop new symptoms. SEEK IMMEDIATE MEDICAL CARE IF:   You have a fever.  You feel very sleepy.  You develop vomiting or diarrhea.  You have a general ill feeling (malaise) with muscle aches and pains. MAKE SURE YOU:   Understand these instructions.  Will watch your condition.  Will get help right away if you are not doing well or get worse. Document Released: 03/05/2005 Document Revised: 11/25/2011 Document Reviewed: 08/11/2011 [facility] Patient Information 2015 [place], [place]. This information is not intended to replace advice given to you by your health care provider. Make sure you discuss any questions you have with your health care provider.

## 2013-12-26 NOTE — Progress Notes (Signed)
VASCULAR LAB PRELIMINARY  PRELIMINARY  PRELIMINARY  PRELIMINARY  Left lower extremity venous Doppler completed.    Preliminary report:  There is no DVT or SVT noted in the left lower extremity.   Gail Mendez, RVT 12/26/2013, 6:02 PM

## 2013-12-26 NOTE — Progress Notes (Signed)
   Subjective:    Patient ID: Gail Mendez, female    DOB: 1956-02-07, 58 y.o.   MRN: [id number]  HPI Gail PageMary J Mendez is a 58 y.o. female [facility]  Left lower extremity swelling: Patient states that she has noticed left lower extremity swelling since Saturday. She reports that her left ankle was extremely red last night, a little improved today with the redness, but is still swollen. She has diminished sensation in her left leg since she was a child, and therefore doesn't feel that it's very painful at this time. She does have a history of a knee replacement greater than 59 years old. 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 She denies any fevers, chills, nausea or vomiting. In addition she denies any open wounds or drainage in her left leg. She did report a history greater than 10 years ago of a blood clot after surgery. She currently is not on any blood thinners including aspirin. She states she cannot take aspirin. She denies any recent travel, surgeries or increase and being sedentary.   Review of Systems Per history of present illness    Objective:   Physical Exam BP 145/83  Pulse 87  Temp(Src) 98.2 F (36.8 C) (Oral)  Wt 189 lb 14.4 oz (86.138 kg) Gen: Pleasant Caucasian female, well-developed, well-nourished obese. No acute distress, nontoxic. Ext: Moderate erythema around left ankle joint extending onto foot. Mild erythema extending medially up to knee. +1 pitting edema in the left foot. Difficult to assess tenderness considering her chronic condition. Appears to be negative Homans. +2/4 posterior tibialis. Skin: No rashes, purpura or petechiae. No skin breakdown. No ulcerations.

## 2013-12-27 ENCOUNTER — Telehealth: Payer: Self-pay | Admitting: Family Medicine

## 2013-12-27 NOTE — Telephone Encounter (Signed)
Please call Gail Mendez and inform her that her WBC count (looks for increased infection) is normal. I would like her to continue her antibiotics  I ordered for her,  , I believe she does have cellulitis., and follow up on Friday with myself, Gail Mendez or Gail Mendez. Thanks.

## 2014-01-02 ENCOUNTER — Other Ambulatory Visit: Payer: Self-pay | Admitting: Family Medicine

## 2014-03-08 ENCOUNTER — Encounter: Payer: Self-pay | Admitting: Family Medicine

## 2014-03-08 ENCOUNTER — Ambulatory Visit (INDEPENDENT_AMBULATORY_CARE_PROVIDER_SITE_OTHER): Payer: Medicare Other | Admitting: Family Medicine

## 2014-03-08 VITALS — BP 131/76 | HR 83 | Temp 97.9°F | Ht <= 58 in | Wt 189.0 lb

## 2014-03-08 DIAGNOSIS — R413 Other amnesia: Secondary | ICD-10-CM

## 2014-03-08 DIAGNOSIS — G3184 Mild cognitive impairment, so stated: Secondary | ICD-10-CM | POA: Insufficient documentation

## 2014-03-08 NOTE — Assessment & Plan Note (Addendum)
MMSE 24/30 today; discussed progressive course and possible medications. Maintaining functional status and with good social support; Will allow time for acceptance of this diagnosis and follow up in 1 month.   At follow up:  - TSH - Vit B12 - Aggressive risk factor modification of CVD  - Likely start cholinesterase inhibitor

## 2014-03-08 NOTE — Patient Instructions (Signed)
Thank you for coming in today!  I'd like to see you in about 1 month where we'll talk more about memory problems and other preventive issues like pap smear and mammography and colonoscopy.    Take care and seek immediate care sooner if you develop any concerns.  Please feel free to call with any questions or concerns at any time, at [phone]. - Dr. Jarvis NewcomerGrunz

## 2014-03-08 NOTE — Progress Notes (Signed)
Patient ID: [name], female   DOB: 1955-11-24, 58 y.o.   MRN: [id number]   Subjective:  Gail Mendez is a 58 y.o. female brought in by daughter in law for evaluation of memory lapses.  Her DIL reports cases of the pt denying any memory of previous events including moving objects without remembering and becoming defensive when asked questions about recent events. She has no problem remembering her previous grandchildren but sometimes forgets her current grandchild's name. She lives with her son and his family and has not driven for over 20 years due to musculoskeletal pain. She has not gotten lost in any familiar places, frequently has trouble finding words, and does not take care of her own finances.   She admits to having frequent memory lapses and fears that she may be heading toward Alzheimer's which many of her family members died from in their 11060s.   Current smoker FH + for Alzheimer's in uncle and much of her father's side of the family she believes.   All other pertinent systems reviewed and are negative. Objective:  BP 131/76  Pulse 83  Temp(Src) 97.9 F (36.6 C) (Oral)  Ht 4\' 10"  (1.473 m)  Wt 189 lb (85.73 kg)  BMI 39.51 kg/m2  Gen:  58 y.o. female in NAD Neuro: MMSE 24/30 (-4 for serial sevens; -2 for 3 word recall); alert; Normal gait, no rigidity or dysmetria.  Assessment & Plan:  Gail Mendez is a 58 y.o. female with mild cognitive impairment:  Problem List Items Addressed This Visit     Nervous and Auditory   MCI (mild cognitive impairment) with memory loss - Primary     MMSE 24/30 today; discussed progressive course and possible medications. Maintaining functional status and with good social support; Will allow time for acceptance of this diagnosis and follow up in 1 month.   At follow up:  - TSH - Vit B12 - Aggressive risk factor modification of CVD  - Likely start cholinesterase inhibitor

## 2014-03-10 ENCOUNTER — Other Ambulatory Visit: Payer: Self-pay | Admitting: Family Medicine

## 2014-05-07 ENCOUNTER — Other Ambulatory Visit: Payer: Self-pay | Admitting: Family Medicine

## 2014-07-05 ENCOUNTER — Other Ambulatory Visit: Payer: Self-pay | Admitting: Family Medicine

## 2014-09-04 ENCOUNTER — Other Ambulatory Visit: Payer: Self-pay | Admitting: Family Medicine

## 2014-10-02 ENCOUNTER — Other Ambulatory Visit: Payer: Self-pay | Admitting: Family Medicine

## 2015-01-02 ENCOUNTER — Other Ambulatory Visit: Payer: Self-pay | Admitting: Family Medicine

## 2015-01-10 ENCOUNTER — Other Ambulatory Visit: Payer: Self-pay | Admitting: Family Medicine

## 2015-02-07 ENCOUNTER — Other Ambulatory Visit: Payer: Self-pay | Admitting: Family Medicine

## 2015-04-08 ENCOUNTER — Other Ambulatory Visit: Payer: Self-pay | Admitting: Family Medicine

## 2015-04-21 ENCOUNTER — Other Ambulatory Visit: Payer: Self-pay | Admitting: Family Medicine

## 2015-07-16 ENCOUNTER — Other Ambulatory Visit: Payer: Self-pay | Admitting: Family Medicine

## 2015-09-28 ENCOUNTER — Emergency Department (HOSPITAL_BASED_OUTPATIENT_CLINIC_OR_DEPARTMENT_OTHER): Payer: Medicare Other

## 2015-09-28 ENCOUNTER — Emergency Department (HOSPITAL_BASED_OUTPATIENT_CLINIC_OR_DEPARTMENT_OTHER)
Admission: EM | Admit: 2015-09-28 | Discharge: 2015-09-28 | Disposition: A | Discharge status: Home / Self Care | Payer: Medicare Other | Attending: Emergency Medicine | Admitting: Emergency Medicine

## 2015-09-28 ENCOUNTER — Encounter (HOSPITAL_BASED_OUTPATIENT_CLINIC_OR_DEPARTMENT_OTHER): Payer: Self-pay | Admitting: Emergency Medicine

## 2015-09-28 DIAGNOSIS — Z791 Long term (current) use of non-steroidal anti-inflammatories (NSAID): Secondary | ICD-10-CM | POA: Insufficient documentation

## 2015-09-28 DIAGNOSIS — M549 Dorsalgia, unspecified: Secondary | ICD-10-CM

## 2015-09-28 DIAGNOSIS — G8929 Other chronic pain: Secondary | ICD-10-CM | POA: Diagnosis not present

## 2015-09-28 DIAGNOSIS — M545 Low back pain: Secondary | ICD-10-CM | POA: Insufficient documentation

## 2015-09-28 DIAGNOSIS — F17200 Nicotine dependence, unspecified, uncomplicated: Secondary | ICD-10-CM | POA: Insufficient documentation

## 2015-09-28 LAB — URINALYSIS, ROUTINE W REFLEX MICROSCOPIC
Bilirubin Urine: NEGATIVE
Glucose, UA: NEGATIVE mg/dL
Hgb urine dipstick: NEGATIVE
KETONES UR: NEGATIVE mg/dL
LEUKOCYTES UA: NEGATIVE
NITRITE: NEGATIVE
PROTEIN: NEGATIVE mg/dL
Specific Gravity, Urine: 1.01 (ref 1.005–1.030)
pH: 6 (ref 5.0–8.0)

## 2015-09-28 MED ORDER — DICLOFENAC SODIUM ER 100 MG PO TB24: 100.0000 mg | ORAL_TABLET | Freq: Every day | ORAL | Status: AC

## 2015-09-28 MED ORDER — DEXAMETHASONE SODIUM PHOSPHATE 10 MG/ML IJ SOLN
10.0000 mg | Freq: Once | INTRAMUSCULAR | Status: AC
  Administered 2015-09-28: 10 mg via INTRAMUSCULAR
  Filled 2015-09-28: qty 1

## 2015-09-28 MED ORDER — METHOCARBAMOL 500 MG PO TABS: 500.0000 mg | ORAL_TABLET | Freq: Two times a day (BID) | ORAL | Status: AC

## 2015-09-28 MED ORDER — METHOCARBAMOL 500 MG PO TABS
1000.0000 mg | ORAL_TABLET | Freq: Once | ORAL | Status: AC
  Administered 2015-09-28: 1000 mg via ORAL
  Filled 2015-09-28: qty 2

## 2015-09-28 MED ORDER — KETOROLAC TROMETHAMINE 60 MG/2ML IM SOLN
60.0000 mg | Freq: Once | INTRAMUSCULAR | Status: AC
  Administered 2015-09-28: 60 mg via INTRAMUSCULAR
  Filled 2015-09-28: qty 2

## 2015-09-28 MED ORDER — LIDOCAINE 5 % EX PTCH: 1.0000 | MEDICATED_PATCH | CUTANEOUS | Status: AC

## 2015-09-28 NOTE — ED Notes (Signed)
Patient states that she is having chronic back pain since Tues. Daughter brought her tonight because she could not longer take the pain

## 2015-09-28 NOTE — ED Provider Notes (Signed)
CSN: [id number]     Arrival date & time 09/28/15  0019 History   First MD Initiated Contact with Patient 09/28/15 0128     Chief Complaint  Patient presents with  . Back Pain     (Consider location/radiation/quality/duration/timing/severity/associated sxs/prior Treatment) Patient is a 60 y.o. female presenting with back pain. The history is provided by the patient.  Back Pain Location:  Lumbar spine Quality:  Aching Radiates to:  Does not radiate Pain severity:  Moderate Pain is:  Same all the time Onset quality:  Gradual Duration: >10 years. Timing:  Constant Progression:  Worsening Chronicity:  Chronic Context: not recent injury   Relieved by:  Nothing Worsened by:  Nothing tried Ineffective treatments:  OTC medications Associated symptoms: no abdominal pain, no abdominal swelling, no bladder incontinence, no bowel incontinence, no chest pain, no dysuria, no fever, no headaches, no leg pain, no numbness, no paresthesias, no pelvic pain, no perianal numbness, no tingling, no weakness and no weight loss   Risk factors: obesity     Past Medical History  Diagnosis Date  . GERD (gastroesophageal reflux disease)   . Chronic back pain    Past Surgical History  Procedure Laterality Date  . Total knee arthroplasty    . Tonsillectomy    . Appendectomy     History reviewed. No pertinent family history. Social History  Substance Use Topics  . Smoking status: Current Every Day Smoker  . Smokeless tobacco: None  . Alcohol Use: No   OB History    No data available     Review of Systems  Constitutional: Negative for fever and weight loss.  Cardiovascular: Negative for chest pain.  Gastrointestinal: Negative for abdominal pain and bowel incontinence.  Genitourinary: Negative for bladder incontinence, dysuria, difficulty urinating and pelvic pain.  Musculoskeletal: Positive for back pain.  Neurological: Negative for tingling, weakness, numbness, headaches and paresthesias.   All other systems reviewed and are negative.     Allergies  Penicillins  Home Medications   Prior to Admission medications   Medication Sig Start Date End Date Taking? Authorizing Provider  diclofenac (VOLTAREN) 75 MG EC tablet Take 75 mg by mouth 2 (two) times daily.   Yes Historical Provider, MD  baclofen (LIORESAL) 20 MG tablet TAKE 1 TABLET BY MOUTH 3 TIMES A DAY AS NEEDED FOR BACK SPASMS 09/05/14   Uvaldo Rising, MD  baclofen (LIORESAL) 20 MG tablet TAKE 1 TABLET BY MOUTH 3 TIMES A DAY AS NEEDED FOR BACK SPASMS 01/03/15   Tobey Grim, MD  doxycycline (VIBRA-TABS) 100 MG tablet Take 1 tablet (100 mg total) by mouth 2 (two) times daily. 12/26/13   Renee A Kuneff, DO  omeprazole (PRILOSEC) 20 MG capsule TAKE 1 CAPSULE (20 MG TOTAL) BY MOUTH DAILY. 07/16/15   Tobey Grim, MD  PROAIR HFA 108 [phone] BASE) MCG/ACT inhaler INHALE 2 PUFFS INTO THE LUNGS EVERY 6 (SIX) HOURS AS NEEDED FOR WHEEZING. 08/03/13   Tobey Grim, MD  simvastatin (ZOCOR) 40 MG tablet TAKE 1 TABLET (40 MG TOTAL) BY MOUTH AT BEDTIME. 04/09/15   Tobey Grim, MD  simvastatin (ZOCOR) 40 MG tablet TAKE 1 TABLET (40 MG TOTAL) BY MOUTH AT BEDTIME. 04/23/15   Tobey Grim, MD  simvastatin (ZOCOR) 40 MG tablet TAKE 1 TABLET (40 MG TOTAL) BY MOUTH AT BEDTIME. 07/16/15   Tobey Grim, MD  triamcinolone cream (KENALOG) 0.1 % Apply topically 2 (two) times daily as needed. Apply to patches on arm  for itching.  May thin skin. 06/25/12   Tobey GrimJeffrey H Walden, MD   BP 160/92 mmHg  Pulse 116  Temp(Src) 98.7 F (37.1 C) (Oral)  Resp 22  Ht 4\' 10"  (1.473 m)  Wt 170 lb (77.111 kg)  BMI 35.54 kg/m2  SpO2 96% Physical Exam  Constitutional: She is oriented to person, place, and time. She appears well-developed and well-nourished. No distress.  HENT:  Head: Normocephalic and atraumatic.  Mouth/Throat: Oropharynx is clear and moist.  Eyes: Conjunctivae are normal. Pupils are equal, round, and reactive to light.  Neck:  Normal range of motion. Neck supple.  Cardiovascular: Normal rate, regular rhythm and intact distal pulses.   Pulmonary/Chest: Effort normal and breath sounds normal. No respiratory distress. She has no wheezes. She has no rales.  Abdominal: Soft. Bowel sounds are normal. There is no tenderness. There is no rebound and no guarding.  Musculoskeletal: Normal range of motion.       Cervical back: Normal.       Thoracic back: Normal.       Lumbar back: Normal.  Gait intact  Neurological: She is alert and oriented to person, place, and time. She has normal reflexes.  Skin: Skin is warm and dry.  Psychiatric: She has a normal mood and affect.    ED Course  Procedures (including critical care time) Labs Review Labs Reviewed  URINALYSIS, ROUTINE W REFLEX MICROSCOPIC (NOT [facility])    Imaging Review No results found. I have personally reviewed and evaluated these images and lab results as part of my medical decision-making.   EKG Interpretation None      MDM   Final diagnoses:  None     Filed Vitals:   09/28/15 0032  BP: 160/92  Pulse: 116  Temp: 98.7 F (37.1 C)  Resp: 22   Results for orders placed or performed during the hospital encounter of 09/28/15  Urinalysis, Routine w reflex microscopic (not at [facility])  Result Value Ref Range   Color, Urine YELLOW YELLOW   APPearance CLEAR CLEAR   Specific Gravity, Urine 1.010 1.005 - 1.030   pH 6.0 5.0 - 8.0   Glucose, UA NEGATIVE NEGATIVE mg/dL   Hgb urine dipstick NEGATIVE NEGATIVE   Bilirubin Urine NEGATIVE NEGATIVE   Ketones, ur NEGATIVE NEGATIVE mg/dL   Protein, ur NEGATIVE NEGATIVE mg/dL   Nitrite NEGATIVE NEGATIVE   Leukocytes, UA NEGATIVE NEGATIVE   Dg Lumbar Spine Complete  09/28/2015  CLINICAL DATA:  Chronic back pain throughout the entire spine. Difficult to make any movements. EXAM: LUMBAR SPINE - COMPLETE 4+ VIEW COMPARISON:  Portable lateral lumbar spine radiographs from 07/13/2006. MRI thoracic and lumbar spine  05/11/2008 FINDINGS: Diffuse bone demineralization. Degenerative changes throughout the lumbar spine with narrowed interspaces and endplate hypertrophic changes. 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 Mild anterior wedging of T11 and T12 with associated degenerative changes. Appearance is similar to previous MRI thoracic spine from 05/11/2008. Postoperative changes with posterior laminectomies from T10 through L1. Alignment of the lumbar spine appears to be normal. No acute vertebral compression deformities. Degenerative changes in the facet joints. Visualized sacrum appears intact. Vascular calcifications in the abdominal aorta. IMPRESSION: Postoperative changes in the thoracic and upper lumbar spine. Degenerative changes. No acute displaced fractures identified. Electronically Signed   By: Burman NievesWilliam  Stevens M.D.   On: 09/28/2015 02:32    Medications  ketorolac (TORADOL) injection 60 mg (60 mg Intramuscular Given 09/28/15 0217)  methocarbamol (ROBAXIN) tablet 1,000 mg (1,000 mg Oral Given  09/28/15 0217)  dexamethasone (DECADRON) injection 10 mg (10 mg Intramuscular Given 09/28/15 0217)    Chronic back pain: NSAIDs muscle relaxants and lidoderm patches.  Follow up with your PMD for recheck this week strict return precautions    Kamariah Fruchter, MD 09/28/15 [phone]

## 2015-09-28 NOTE — ED Notes (Signed)
Patient transported to X-ray 

## 2015-10-03 ENCOUNTER — Other Ambulatory Visit: Payer: Self-pay | Admitting: Neurosurgery

## 2015-10-03 DIAGNOSIS — M5416 Radiculopathy, lumbar region: Secondary | ICD-10-CM

## 2015-11-12 ENCOUNTER — Ambulatory Visit
Admission: RE | Admit: 2015-11-12 | Discharge: 2015-11-12 | Disposition: A | Discharge status: Home / Self Care | Payer: Medicare Other | Source: Ambulatory Visit | Attending: Neurosurgery | Admitting: Neurosurgery

## 2015-11-12 ENCOUNTER — Inpatient Hospital Stay: Admission: RE | Admit: 2015-11-12 | Payer: Medicare Other | Source: Ambulatory Visit

## 2015-11-12 DIAGNOSIS — M5416 Radiculopathy, lumbar region: Secondary | ICD-10-CM

## 2015-11-12 MED ORDER — GADOBENATE DIMEGLUMINE 529 MG/ML IV SOLN
16.0000 mL | Freq: Once | INTRAVENOUS | Status: AC | PRN
  Administered 2015-11-12: 16 mL via INTRAVENOUS

## 2022-12-06 ENCOUNTER — Encounter (HOSPITAL_BASED_OUTPATIENT_CLINIC_OR_DEPARTMENT_OTHER): Payer: Self-pay | Admitting: Emergency Medicine

## 2022-12-06 ENCOUNTER — Other Ambulatory Visit: Payer: Self-pay

## 2022-12-06 ENCOUNTER — Emergency Department (HOSPITAL_BASED_OUTPATIENT_CLINIC_OR_DEPARTMENT_OTHER): Payer: Medicare HMO

## 2022-12-06 ENCOUNTER — Emergency Department (HOSPITAL_BASED_OUTPATIENT_CLINIC_OR_DEPARTMENT_OTHER)
Admission: EM | Admit: 2022-12-06 | Discharge: 2022-12-06 | Disposition: A | Discharge status: Home / Self Care | Payer: Medicare HMO | Attending: Emergency Medicine | Admitting: Emergency Medicine

## 2022-12-06 DIAGNOSIS — X501XXA Overexertion from prolonged static or awkward postures, initial encounter: Secondary | ICD-10-CM | POA: Diagnosis not present

## 2022-12-06 DIAGNOSIS — Y9301 Activity, walking, marching and hiking: Secondary | ICD-10-CM | POA: Insufficient documentation

## 2022-12-06 DIAGNOSIS — M25571 Pain in right ankle and joints of right foot: Secondary | ICD-10-CM | POA: Diagnosis present

## 2022-12-06 DIAGNOSIS — S93491A Sprain of other ligament of right ankle, initial encounter: Secondary | ICD-10-CM | POA: Diagnosis not present

## 2022-12-06 DIAGNOSIS — S93401A Sprain of unspecified ligament of right ankle, initial encounter: Secondary | ICD-10-CM

## 2022-12-06 NOTE — Discharge Instructions (Signed)
Wear the brace as needed when you are up and moving around and use your walker to help so you will have to put full weight on your foot.  The x-ray does not show any sign of fracture today.  Ice and elevate as tolerated.

## 2022-12-06 NOTE — ED Notes (Signed)
D/c paperwork reviewed with pt, including follow up care.  No questions or concerns voiced at time of d/c. . Pt verbalized understanding, Wheeled by family to ED exit, NAD.   

## 2022-12-06 NOTE — ED Provider Notes (Signed)
[place] [facility] Provider Note   CSN: [id number] Arrival date & time: 12/06/22  2017     History  Chief Complaint  Patient presents with   Ankle Pain    ALIENA GRONBERG is a 67 y.o. female.  Patient is a 67 year old female with a history of multiple issues with her knees, chronic back pain and GERD who is presenting today due to persistent right ankle pain and swelling.  She was walking yesterday and reports her ankle just twisted.  Since that time she has had pain and swelling to the ankle.  It was worse today when she woke up and is very painful to walk on so she came for evaluation.  She denies any new pain anywhere else.  The history is provided by the patient.  Ankle Pain      Home Medications Prior to Admission medications   Medication Sig Start Date End Date Taking? Authorizing Provider  baclofen (LIORESAL) 20 MG tablet TAKE 1 TABLET BY MOUTH 3 TIMES A DAY AS NEEDED FOR BACK SPASMS 09/05/14   Uvaldo Rising, MD  baclofen (LIORESAL) 20 MG tablet TAKE 1 TABLET BY MOUTH 3 TIMES A DAY AS NEEDED FOR BACK SPASMS 01/03/15   Tobey Grim, MD  diclofenac (VOLTAREN) 75 MG EC tablet Take 75 mg by mouth 2 (two) times daily.    [provider]  Diclofenac Sodium CR (VOLTAREN-XR) 100 MG 24 hr tablet Take 1 tablet (100 mg total) by mouth daily. 09/28/15   Palumbo, April, MD  doxycycline (VIBRA-TABS) 100 MG tablet Take 1 tablet (100 mg total) by mouth 2 (two) times daily. 12/26/13   Kuneff, Renee A, DO  lidocaine (LIDODERM) 5 % Place 1 patch onto the skin daily. Remove & Discard patch within 12 hours or as directed by MD 09/28/15   Nicanor Alcon, April, MD  methocarbamol (ROBAXIN) 500 MG tablet Take 1 tablet (500 mg total) by mouth 2 (two) times daily. 09/28/15   Palumbo, April, MD  omeprazole (PRILOSEC) 20 MG capsule TAKE 1 CAPSULE (20 MG TOTAL) BY MOUTH DAILY. 07/16/15   Tobey Grim, MD  PROAIR HFA 108 (90 BASE) MCG/ACT inhaler INHALE 2 PUFFS  INTO THE LUNGS EVERY 6 (SIX) HOURS AS NEEDED FOR WHEEZING. 08/03/13   Tobey Grim, MD  simvastatin (ZOCOR) 40 MG tablet TAKE 1 TABLET (40 MG TOTAL) BY MOUTH AT BEDTIME. 04/09/15   Tobey Grim, MD  simvastatin (ZOCOR) 40 MG tablet TAKE 1 TABLET (40 MG TOTAL) BY MOUTH AT BEDTIME. 04/23/15   Tobey Grim, MD  simvastatin (ZOCOR) 40 MG tablet TAKE 1 TABLET (40 MG TOTAL) BY MOUTH AT BEDTIME. 07/16/15   Tobey Grim, MD  triamcinolone cream (KENALOG) 0.1 % Apply topically 2 (two) times daily as needed. Apply to patches on arm for itching.  May thin skin. 06/25/12   Tobey Grim, MD      Allergies    Penicillins    Review of Systems   Review of Systems  Physical Exam Updated Vital Signs BP (!) 148/76 (BP Location: Left Arm)   Pulse 85   Temp 98 F (36.7 C) (Oral)   Resp (!) 24   SpO2 93%  Physical Exam Vitals and nursing note reviewed.  Cardiovascular:     Rate and Rhythm: Normal rate.  Pulmonary:     Effort: Pulmonary effort is normal.  Musculoskeletal:        General: Swelling and tenderness present.  Right ankle: Swelling and ecchymosis present. Tenderness present over the lateral malleolus and ATF ligament. No posterior TF ligament, base of 5th metatarsal or proximal fibula tenderness. Normal range of motion. Normal pulse.  Skin:    General: Skin is warm and dry.  Neurological:     Mental Status: She is alert. Mental status is at baseline.     ED Results / Procedures / Treatments   Labs (all labs ordered are listed, but only abnormal results are displayed) Labs Reviewed - No data to display  EKG None  Radiology DG Ankle Complete Right  Result Date: 12/06/2022 CLINICAL DATA:  Ankle pain twisting injury EXAM: RIGHT ANKLE - COMPLETE 3+ VIEW COMPARISON:  None Available. FINDINGS: Fixating screw across the posterior talocalcaneal articulation. Generalized soft tissue swelling. No convincing fracture or dislocation. Moderate medial and lateral  degenerative changes IMPRESSION: Soft tissue swelling. No acute osseous abnormality. Degenerative changes. Electronically Signed   By: Jasmine Pang M.D.   On: 12/06/2022 20:51    Procedures Procedures    Medications Ordered in ED Medications - No data to display  ED Course/ Medical Decision Making/ A&P                             Medical Decision Making Amount and/or Complexity of Data Reviewed Radiology: ordered and independent interpretation performed. Decision-making details documented in ED Course.   Patient presenting today with an ankle injury.  Suspect most likely ankle sprain but will image to ensure no evidence of fracture.  No proximal pain concerning for fibular head fracture.  Neurovascularly intact.  I have independently visualized and interpreted pt's images today.  Ankle images negative for acute fracture.  Findings discussed with the patient.  She was placed in an Aircast and has a walker at home to use as needed.  She also has a orthopedist she can follow-up with.  No social determinants affecting discharge today.        Final Clinical Impression(s) / ED Diagnoses Final diagnoses:  Sprain of right ankle, unspecified ligament, initial encounter    Rx / DC Orders ED Discharge Orders     None         Gwyneth Sprout, MD 12/06/22 2123

## 2022-12-06 NOTE — ED Triage Notes (Signed)
Patient twisted right ankle when walking this evening.  Pain and swelling to right ankle.
